# Patient Record
Sex: Female | Born: 1980 | Race: White | Hispanic: No | Marital: Married | Smoking: Never smoker
Health system: Southern US, Community
[De-identification: ages and names within clinical notes are randomized; demographics above are authoritative.]

## PROBLEM LIST (undated history)

## (undated) DIAGNOSIS — T7819XA Other adverse food reactions, not elsewhere classified, initial encounter: Secondary | ICD-10-CM

## (undated) DIAGNOSIS — E079 Disorder of thyroid, unspecified: Secondary | ICD-10-CM

## (undated) DIAGNOSIS — T781XXA Other adverse food reactions, not elsewhere classified, initial encounter: Secondary | ICD-10-CM

## (undated) DIAGNOSIS — J309 Allergic rhinitis, unspecified: Secondary | ICD-10-CM

## (undated) DIAGNOSIS — K589 Irritable bowel syndrome without diarrhea: Secondary | ICD-10-CM

## (undated) HISTORY — DX: Allergic rhinitis, unspecified: J30.9

## (undated) HISTORY — DX: Other adverse food reactions, not elsewhere classified, initial encounter: T78.19XA

## (undated) HISTORY — PX: CHOLECYSTECTOMY: SHX55

## (undated) HISTORY — DX: Other adverse food reactions, not elsewhere classified, initial encounter: T78.1XXA

## (undated) HISTORY — PX: OVARIAN CYST REMOVAL: SHX89

---
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
## 1999-05-16 ENCOUNTER — Encounter: Payer: Self-pay | Admitting: Family Medicine

## 1999-05-16 ENCOUNTER — Ambulatory Visit (HOSPITAL_COMMUNITY): Admission: RE | Admit: 1999-05-16 | Discharge: 1999-05-16 | Payer: Self-pay | Admitting: Family Medicine

## 1999-06-27 ENCOUNTER — Encounter: Payer: Self-pay | Admitting: Family Medicine

## 1999-06-27 ENCOUNTER — Ambulatory Visit (HOSPITAL_COMMUNITY): Admission: RE | Admit: 1999-06-27 | Discharge: 1999-06-27 | Payer: Self-pay | Admitting: Family Medicine

## 2000-05-19 ENCOUNTER — Other Ambulatory Visit: Admission: RE | Admit: 2000-05-19 | Discharge: 2000-05-19 | Payer: Self-pay | Admitting: Obstetrics and Gynecology

## 2001-06-09 ENCOUNTER — Other Ambulatory Visit: Admission: RE | Admit: 2001-06-09 | Discharge: 2001-06-09 | Payer: Self-pay | Admitting: Obstetrics and Gynecology

## 2001-06-27 ENCOUNTER — Encounter: Admission: RE | Admit: 2001-06-27 | Discharge: 2001-06-27 | Payer: Self-pay | Admitting: Obstetrics and Gynecology

## 2001-06-27 ENCOUNTER — Encounter: Payer: Self-pay | Admitting: Obstetrics and Gynecology

## 2002-08-03 ENCOUNTER — Other Ambulatory Visit: Admission: RE | Admit: 2002-08-03 | Discharge: 2002-08-03 | Payer: Self-pay | Admitting: Obstetrics and Gynecology

## 2003-11-28 ENCOUNTER — Other Ambulatory Visit: Admission: RE | Admit: 2003-11-28 | Discharge: 2003-11-28 | Payer: Self-pay | Admitting: Obstetrics and Gynecology

## 2004-04-07 ENCOUNTER — Inpatient Hospital Stay (HOSPITAL_COMMUNITY): Admission: AD | Admit: 2004-04-07 | Discharge: 2004-04-07 | Payer: Self-pay | Admitting: Obstetrics and Gynecology

## 2004-04-10 ENCOUNTER — Ambulatory Visit (HOSPITAL_COMMUNITY): Admission: RE | Admit: 2004-04-10 | Discharge: 2004-04-10 | Payer: Self-pay | Admitting: Obstetrics and Gynecology

## 2004-04-16 ENCOUNTER — Ambulatory Visit (HOSPITAL_COMMUNITY): Admission: RE | Admit: 2004-04-16 | Discharge: 2004-04-16 | Payer: Self-pay | Admitting: Obstetrics and Gynecology

## 2004-06-11 ENCOUNTER — Inpatient Hospital Stay (HOSPITAL_COMMUNITY): Admission: RE | Admit: 2004-06-11 | Discharge: 2004-06-14 | Payer: Self-pay | Admitting: Obstetrics and Gynecology

## 2004-07-23 ENCOUNTER — Other Ambulatory Visit: Admission: RE | Admit: 2004-07-23 | Discharge: 2004-07-23 | Payer: Self-pay | Admitting: Obstetrics and Gynecology

## 2006-04-01 ENCOUNTER — Emergency Department (HOSPITAL_COMMUNITY): Admission: EM | Admit: 2006-04-01 | Discharge: 2006-04-02 | Payer: Self-pay | Admitting: Emergency Medicine

## 2006-07-28 ENCOUNTER — Ambulatory Visit (HOSPITAL_COMMUNITY): Admission: RE | Admit: 2006-07-28 | Discharge: 2006-07-28 | Payer: Self-pay | Admitting: Obstetrics and Gynecology

## 2006-08-04 ENCOUNTER — Ambulatory Visit (HOSPITAL_COMMUNITY): Admission: RE | Admit: 2006-08-04 | Discharge: 2006-08-04 | Payer: Self-pay | Admitting: Obstetrics and Gynecology

## 2006-08-11 ENCOUNTER — Encounter: Payer: Self-pay | Admitting: Obstetrics and Gynecology

## 2006-08-11 ENCOUNTER — Observation Stay (HOSPITAL_COMMUNITY): Admission: AD | Admit: 2006-08-11 | Discharge: 2006-08-12 | Payer: Self-pay | Admitting: Obstetrics and Gynecology

## 2006-08-19 ENCOUNTER — Ambulatory Visit (HOSPITAL_COMMUNITY): Admission: RE | Admit: 2006-08-19 | Discharge: 2006-08-19 | Payer: Self-pay | Admitting: Obstetrics and Gynecology

## 2006-08-27 ENCOUNTER — Ambulatory Visit (HOSPITAL_COMMUNITY): Admission: RE | Admit: 2006-08-27 | Discharge: 2006-08-27 | Payer: Self-pay | Admitting: Obstetrics and Gynecology

## 2006-08-27 ENCOUNTER — Encounter: Payer: Self-pay | Admitting: Obstetrics and Gynecology

## 2006-09-02 ENCOUNTER — Ambulatory Visit (HOSPITAL_COMMUNITY): Admission: RE | Admit: 2006-09-02 | Discharge: 2006-09-02 | Payer: Self-pay | Admitting: Obstetrics and Gynecology

## 2006-09-10 ENCOUNTER — Ambulatory Visit (HOSPITAL_COMMUNITY): Admission: RE | Admit: 2006-09-10 | Discharge: 2006-09-10 | Payer: Self-pay | Admitting: Obstetrics and Gynecology

## 2006-09-12 ENCOUNTER — Inpatient Hospital Stay (HOSPITAL_COMMUNITY): Admission: AD | Admit: 2006-09-12 | Discharge: 2006-09-12 | Payer: Self-pay | Admitting: Obstetrics and Gynecology

## 2006-09-13 ENCOUNTER — Inpatient Hospital Stay (HOSPITAL_COMMUNITY): Admission: AD | Admit: 2006-09-13 | Discharge: 2006-09-16 | Payer: Self-pay | Admitting: Obstetrics and Gynecology

## 2008-01-09 IMAGING — US US OB LIMITED
1 series · 14 of 28 positions shown · non-contrast
Comparison: none

OBSTETRICAL ULTRASOUND:
 This ultrasound was performed in The [HOSPITAL], and the [facility] report will be stored to [REDACTED] PACS.

[Series 1: us ob limited · 14 of 28 slices shown]
[im 2/28]
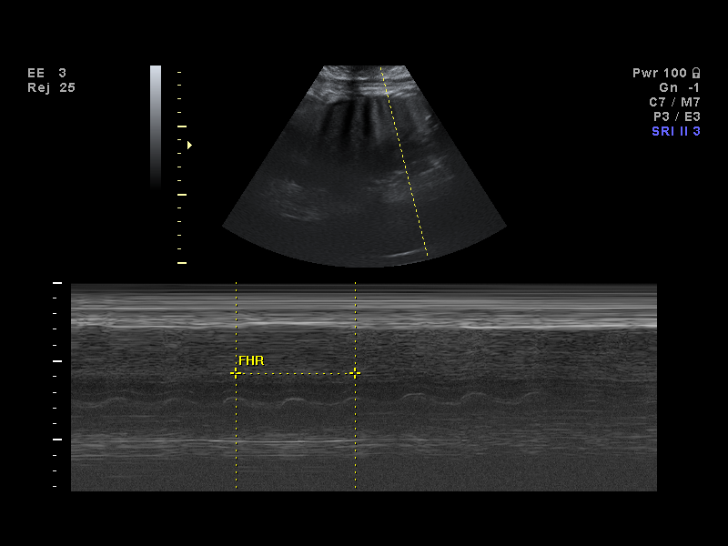
[im 4/28]
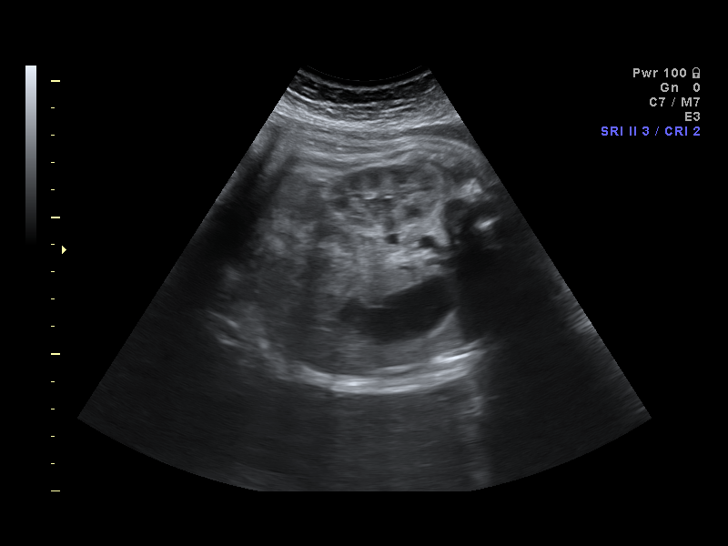
[im 6/28]
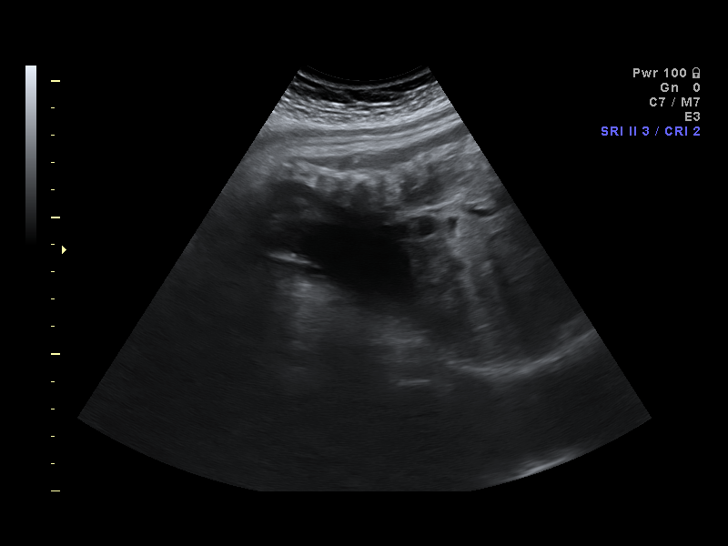
[im 8/28]
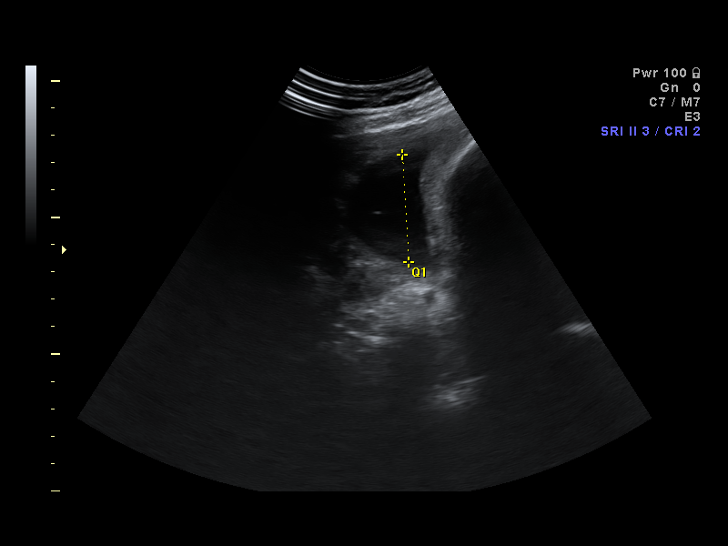
[im 10/28]
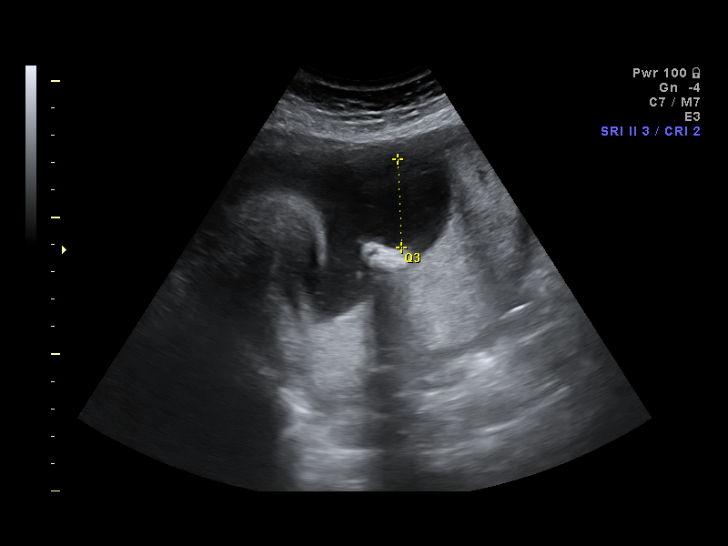
[im 12/28]
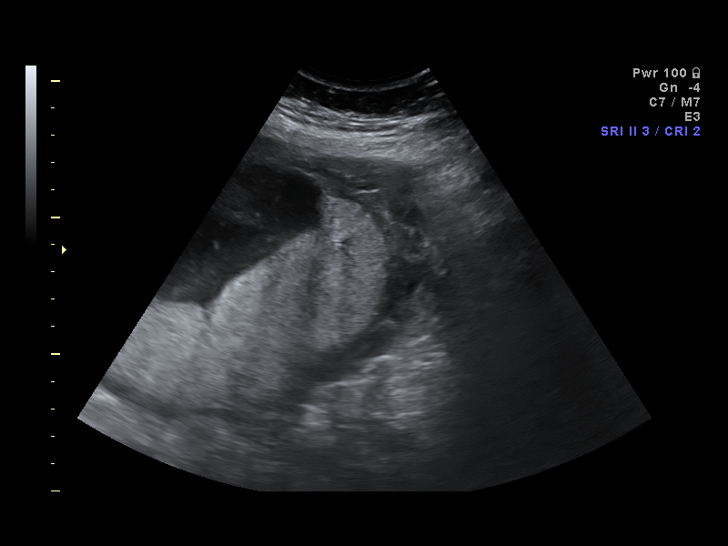
[im 14/28]
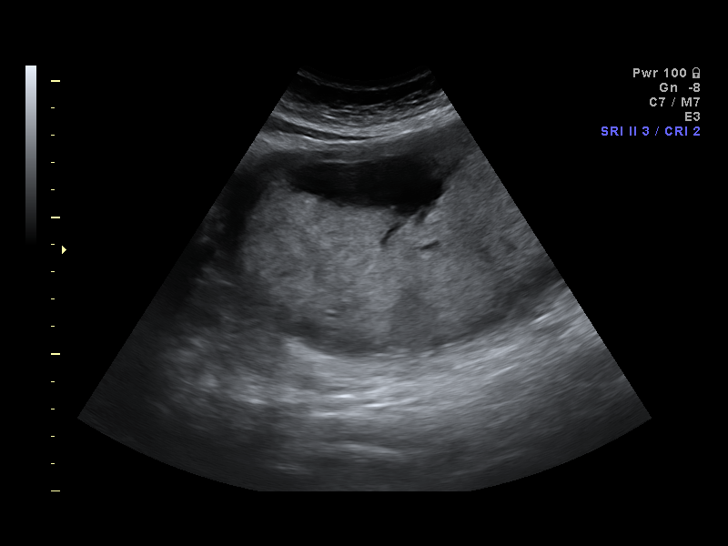
[im 16/28]
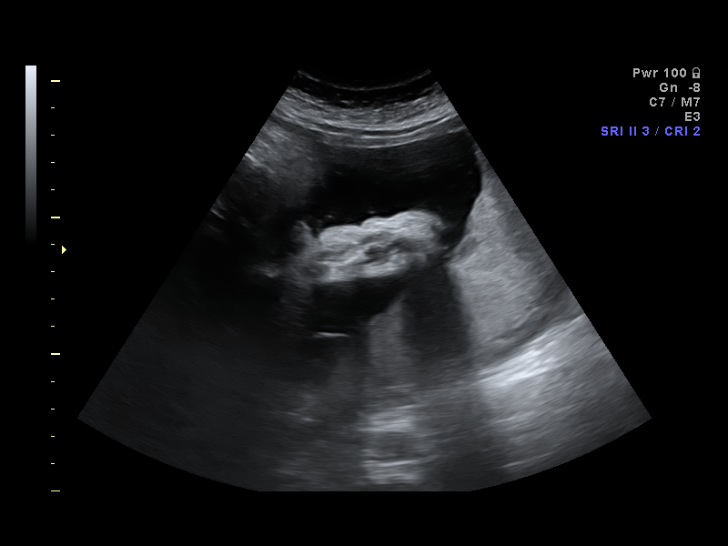
[im 18/28]
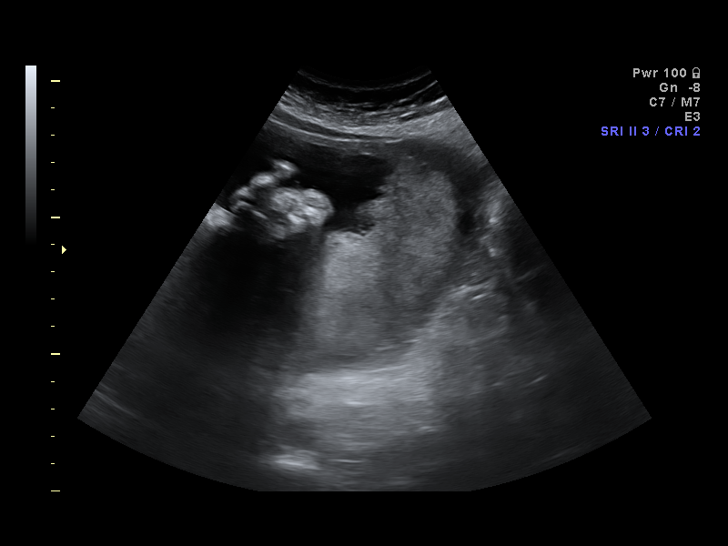
[im 20/28]
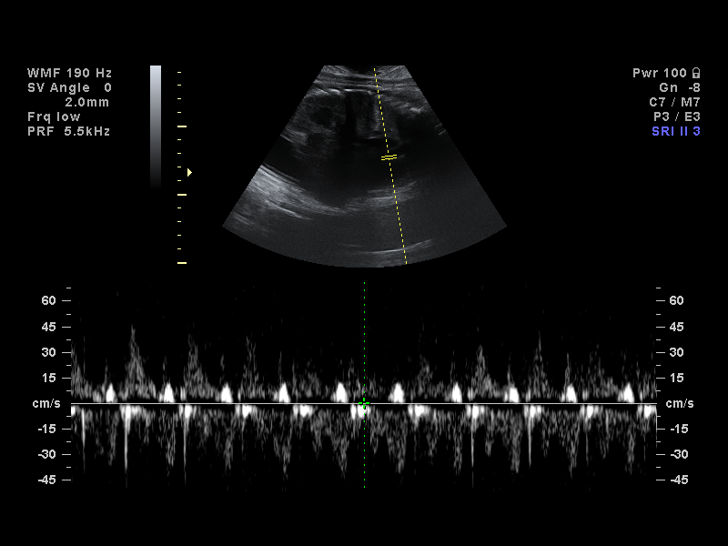
[im 22/28]
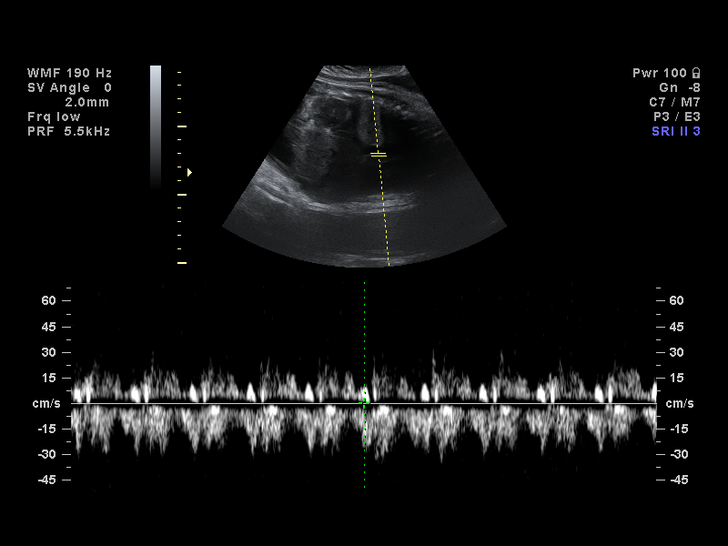
[im 24/28]
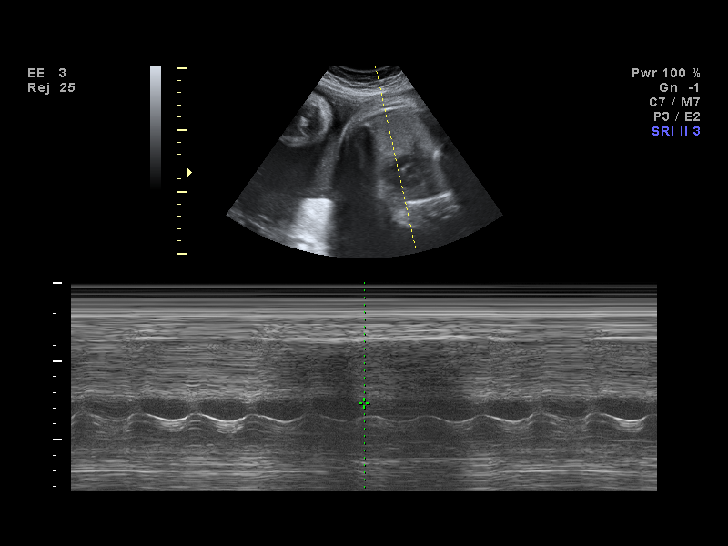
[im 26/28]
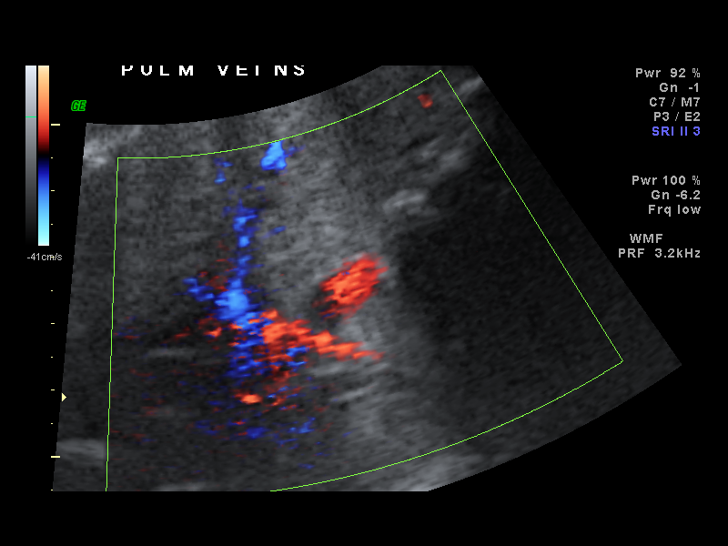
[im 28/28]
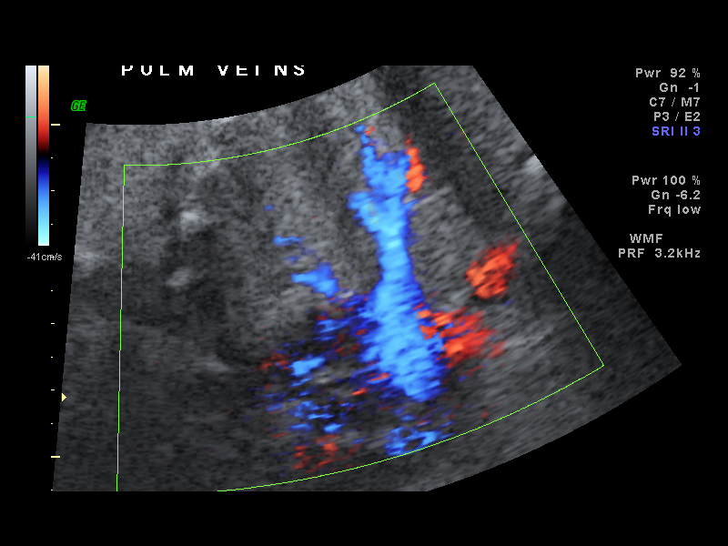

[14 of 28 positions shown; findings below may reference images not displayed]

IMPRESSION: The [facility] report has also been faxed to the ordering physician.

## 2008-01-31 IMAGING — US US OB FOLLOW-UP
1 series · 14 of 22 positions shown · non-contrast
Comparison: none

OBSTETRICAL ULTRASOUND:
 This ultrasound was performed in The [HOSPITAL], and the [facility] report will be stored to [REDACTED] PACS.

[Series 1: us ob follow-up · 14 of 22 slices shown]
[im 1/22]
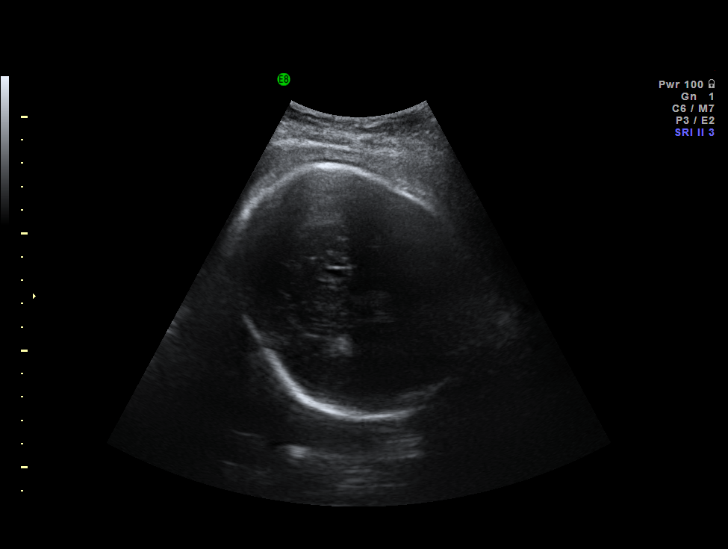
[im 3/22]
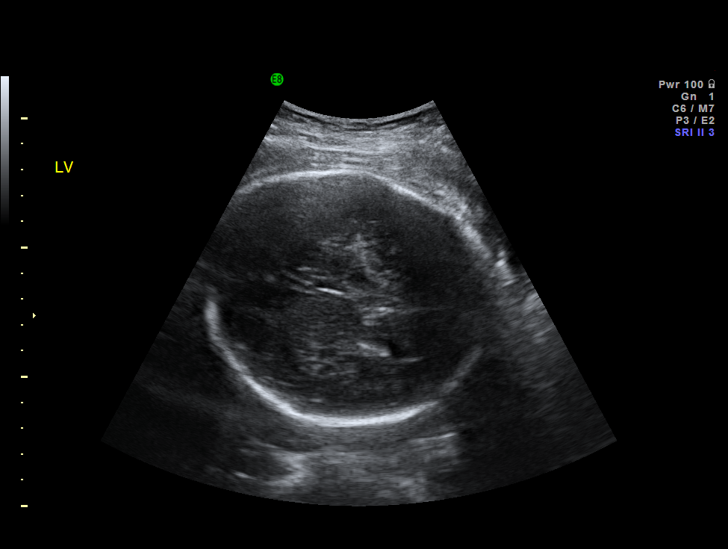
[im 4/22]
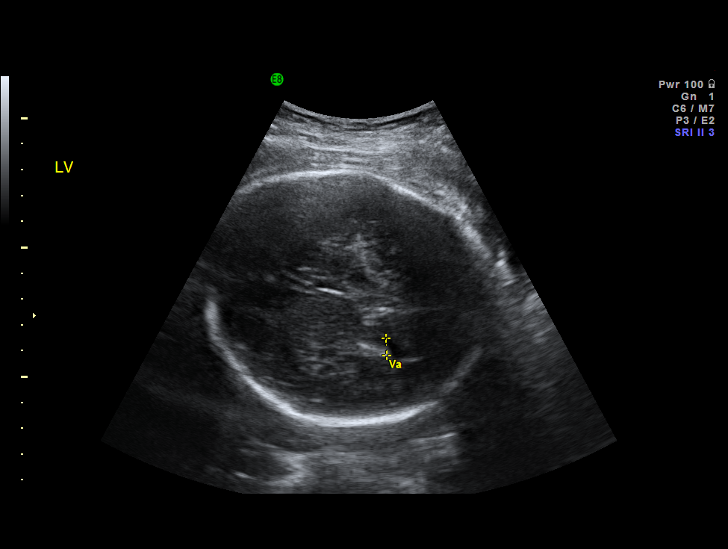
[im 6/22]
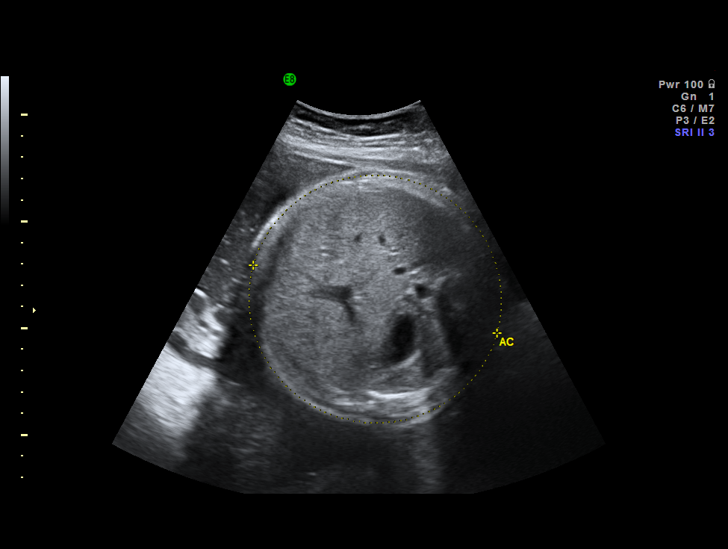
[im 8/22]
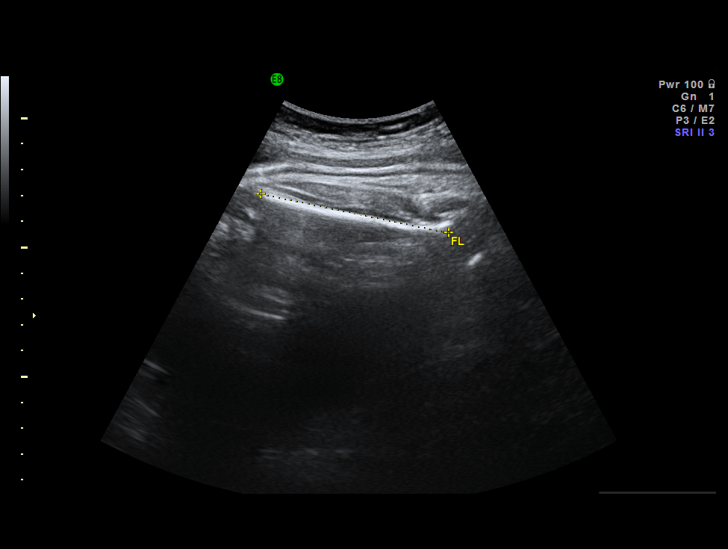
[im 9/22]
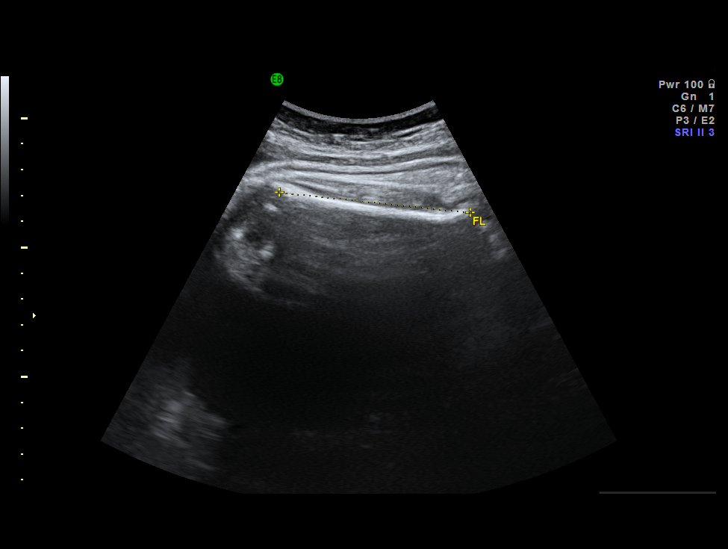
[im 11/22]
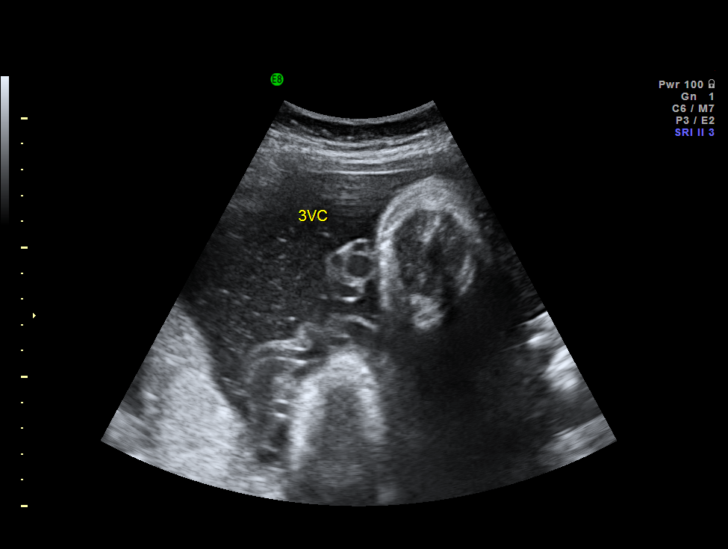
[im 12/22]
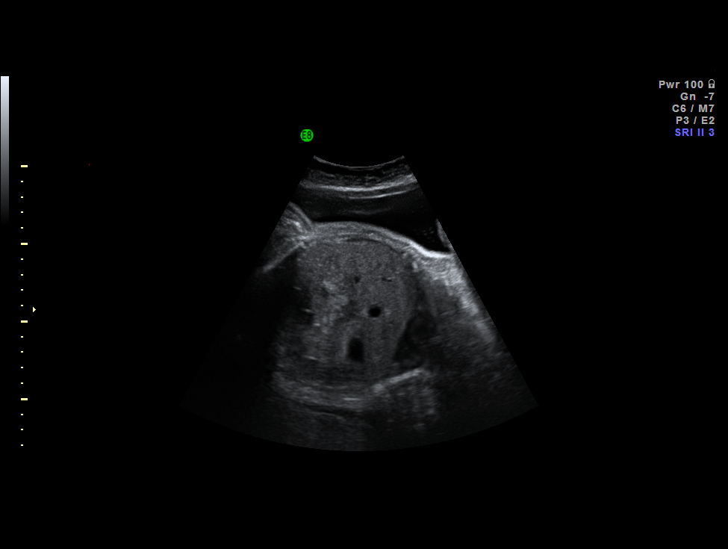
[im 14/22]
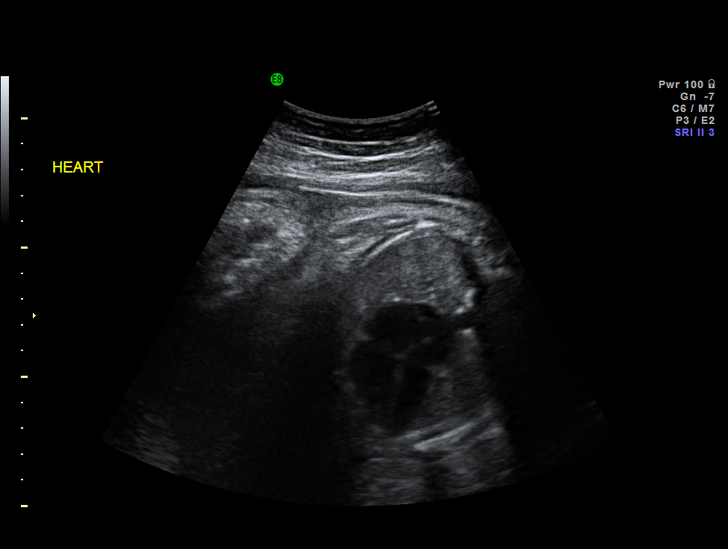
[im 15/22]
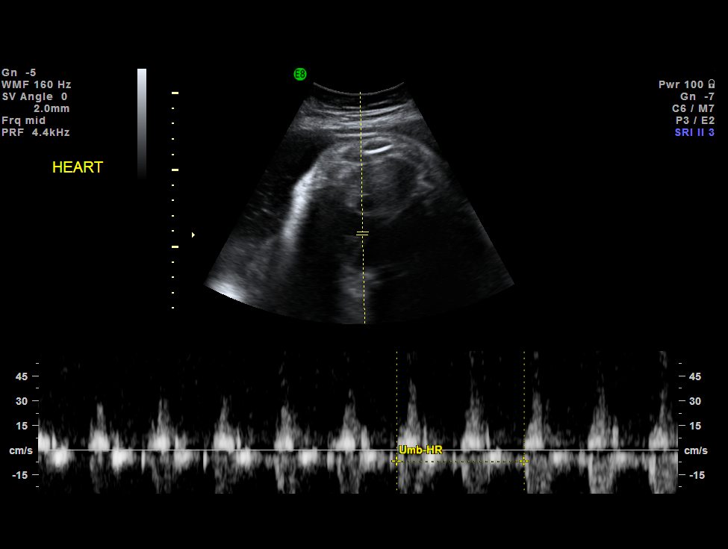
[im 17/22]
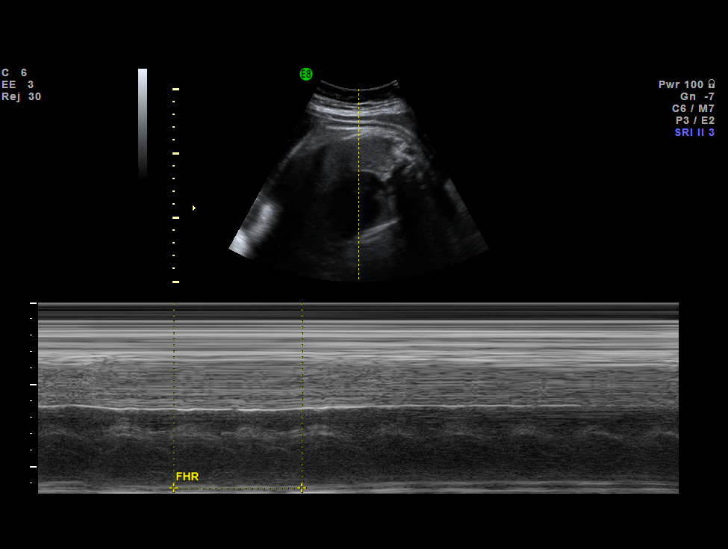
[im 19/22]
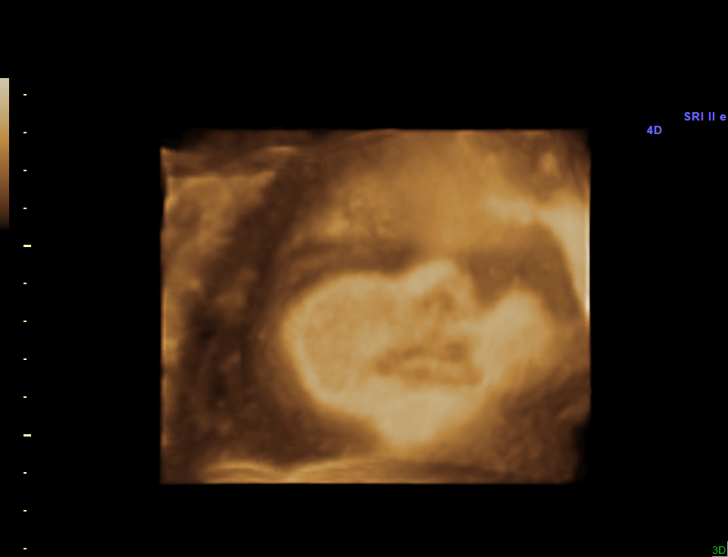
[im 20/22]
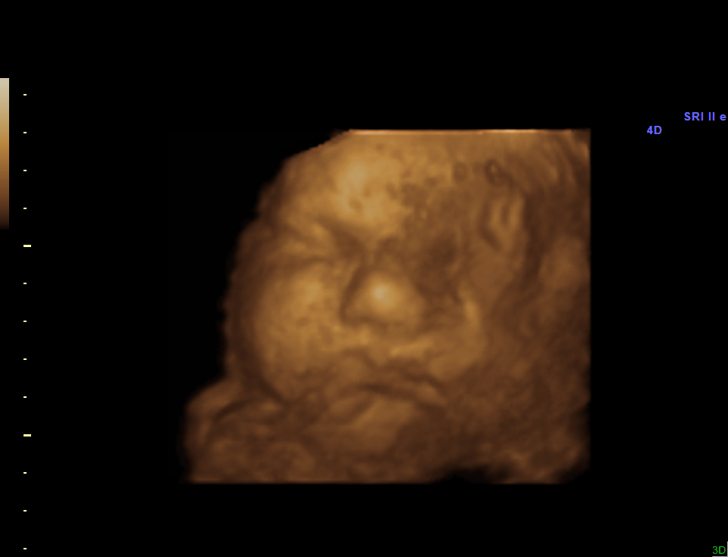
[im 22/22]
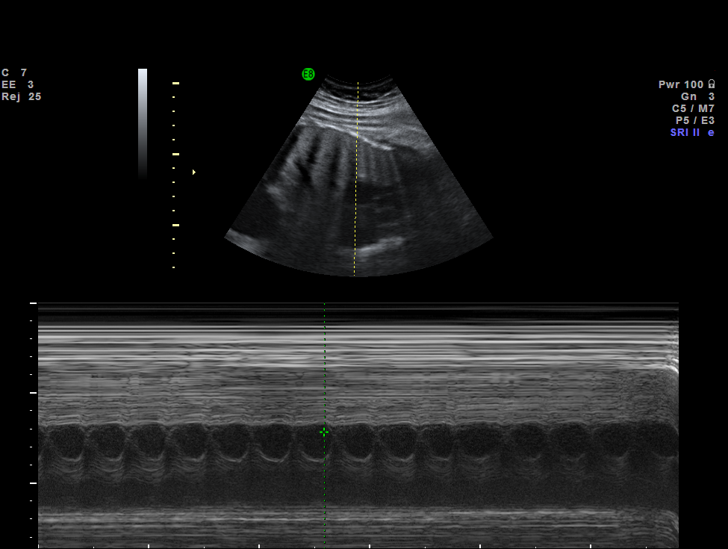

[14 of 22 positions shown; findings below may reference images not displayed]

IMPRESSION: The [facility] report has also been faxed to the ordering physician.

## 2008-12-24 ENCOUNTER — Encounter: Admission: RE | Admit: 2008-12-24 | Discharge: 2008-12-24 | Payer: Self-pay | Admitting: Physician Assistant

## 2009-06-18 ENCOUNTER — Ambulatory Visit (HOSPITAL_COMMUNITY): Admission: RE | Admit: 2009-06-18 | Discharge: 2009-06-18 | Payer: Self-pay | Admitting: Obstetrics and Gynecology

## 2009-06-21 ENCOUNTER — Inpatient Hospital Stay (HOSPITAL_COMMUNITY): Admission: AD | Admit: 2009-06-21 | Discharge: 2009-06-21 | Payer: Self-pay | Admitting: Obstetrics and Gynecology

## 2010-04-11 ENCOUNTER — Encounter
Admission: RE | Admit: 2010-04-11 | Discharge: 2010-04-11 | Payer: Self-pay | Source: Home / Self Care | Attending: Family Medicine | Admitting: Family Medicine

## 2010-04-20 ENCOUNTER — Encounter: Payer: Self-pay | Admitting: Obstetrics and Gynecology

## 2010-06-22 LAB — CBC
HCT: 36 % (ref 36.0–46.0)
Hemoglobin: 12.7 g/dL (ref 12.0–15.0)
MCV: 91.4 fL (ref 78.0–100.0)
RBC: 3.93 MIL/uL (ref 3.87–5.11)
RBC: 4.19 MIL/uL (ref 3.87–5.11)
WBC: 5.6 10*3/uL (ref 4.0–10.5)
WBC: 8.2 10*3/uL (ref 4.0–10.5)

## 2010-08-12 NOTE — Op Note (Signed)
Angel Richmond, Angel Richmond             ACCOUNT NO.:  [id number]   MEDICAL RECORD NO.:  [id number]          PATIENT TYPE:  INP   LOCATION:  9199                          [facility]ACILITY:  [facility]   PHYSICIAN:  Michelle L. Grewal, M.D.DATE OF BIRTH:  1981/02/25   DATE OF PROCEDURE:  09/13/2006  DATE OF DISCHARGE:                               OPERATIVE REPORT   PREOPERATIVE DIAGNOSIS:  Intrauterine pregnancy at term, previous C-  section, SROM and labor.   POSTOPERATIVE DIAGNOSIS:  Intrauterine pregnancy at term, previous C-  section, SROM and labor.   PROCEDURE:  Repeat low transverse cesarean section.   SURGEON:  Dr. Vincente Poli   ANESTHESIA:  Spinal.   SPECIMENS:  Female infant in cephalic presentation, Apgar 9 at 1 minute  and 9 at 5 minutes, weighing 8 pounds 15 ounces.   ESTIMATED BLOOD LOSS:  500 mL.   COMPLICATIONS:  None.   PROCEDURE:  Patient was taken to the operating room after informed  consent was obtained.  She was then prepped and draped in the usual  sterile fashion after spinal was placed. A Foley catheter was inserted  and draining clear urine.  A low transverse incision was made, carried  down to the fascia, fascia scored in the midline and extended laterally.  The rectus muscles were separated in the midline.  The peritoneum was  entered bluntly.  The peritoneal incision was then stretched.  The  bladder blade was inserted in the lower uterine segment and the bladder  flap was created sharply and then digitally.  The bladder blade was  readjusted.  A low transverse incision was made in the uterus. Uterus  was entered using a hemostat and the baby was in cephalic presentation  and was delivered easily with one gentle pull of the vacuum.  The baby  was a female infant weighing 8 pounds 15 ounces with Apgars 9 at one  minute and 9 at 5 minutes.  The cord was clamped and cut, the baby was  handed to the awaiting neonatal team and taken to nursery. The cord  blood was obtained.   The placenta was manually removed and noted to be  normal and intact with three-vessel cord.  The uterus was exteriorized  and cleared of all clots and debris.  Pitocin and antibiotics were  given. The adnexa were normal.  The uterine cavity was cleared of all  clots and debris again and the incision was closed in one layer using 0  chromic in continuous running locked stitch.  Hemostasis was excellent.  The uterus was returned to the abdomen.  Irrigation was performed.  Hemostasis was again noted to be excellent.  The peritoneum was closed  using 0 Vicryl.  The fascia was closed using 0 Vicryl in a running  stitch x2  starting at each corner and meeting in the midline. After irrigation of  subcutaneous layer the skin was closed with a subcuticular using 3-0  Vicryl and skin was closed with Dermabond.  All sponge, lap and  instrument counts were correct x2.  The patient went to recovery room in  stable condition.  Michelle L. Vincente Poli, M.D.  Electronically Signed     MLG/MEDQ  D:  09/13/2006  T:  09/13/2006  Job:  [id number]

## 2010-08-12 NOTE — Discharge Summary (Signed)
NAMEJAMONICA, Angel Richmond NO.:  [id number]   MEDICAL RECORD NO.:  [id number]          PATIENT TYPE:  OUT   LOCATION:  MFM                           [facility]   PHYSICIAN:  Juluis Mire, M.D.   DATE OF BIRTH:  October 22, 1980   DATE OF ADMISSION:  08/11/2006  DATE OF DISCHARGE:                               DISCHARGE SUMMARY   ADMISSION DIAGNOSIS:  Intrauterine pregnancy at 47 and 4/7 weeks with  fetal cardiac arrhythmia.   DISCHARGE DIAGNOSIS:  Intrauterine pregnancy at 27 and 4/7 weeks with  fetal cardiac arrhythmia.   HISTORY AND PHYSICAL:  For a complete history and physical examination,  please see red note.   COURSE IN THE HOSPITAL:  The patient was brought in, underwent  ultrasound at maternal fetal medicine.  Cardiac evaluation appeared to  be normal.  __________  premature atrial contractions.  Subsequently,  was monitored throughout the night.  Fetal heart remained reactive.  No  decelerations.  She did have uterine irritability, although the cervix  remained long and closed with no change.  We contacted maternal fetal  medicine and talked with them.  They were in agreement to let her go  home.  She will see him twice a week in the office for nonstress  testing.  She will have an echocardiogram for the baby and a followup  ultrasound in the maternal fetal medicine.   In terms of complications, none were encountered during stay in the  hospital, the patient was discharged home in stable condition.   DISPOSITION:  Follow up Monday for a repeat NST.  Call with increasing  uterine activity, leaking or bleed or any concerns.      Juluis Mire, M.D.  Electronically Signed     JSM/MEDQ  D:  08/12/2006  T:  08/12/2006  Job:  [id number]

## 2010-08-15 NOTE — Op Note (Signed)
Angel Richmond, Angel Richmond             ACCOUNT NO.:  [id number]   MEDICAL RECORD NO.:  [id number]          PATIENT TYPE:  INP   LOCATION:  9145                          [facility]   PHYSICIAN:  Guy Sandifer. Tomblin II, M.D.DATE OF BIRTH:  03-Jul-1980   DATE OF PROCEDURE:  06/11/2004  DATE OF DISCHARGE:                                 OPERATIVE REPORT   PREOPERATIVE DIAGNOSES:  1.  Intrauterine pregnancy at 85 weeks estimated gestational age.  2.  Back pain.   POSTOPERATIVE DIAGNOSES:  1.  Intrauterine pregnancy at 47 weeks estimated gestational age.  2.  Back pain.   OPERATION PERFORMED:  Low transverse cesarean section.   SURGEON:  Guy Sandifer. Henderson Cloud, M.D.   ANESTHESIA:  Spinal.  Belva Agee, M.D.   ESTIMATED BLOOD LOSS:  800 cc.   FINDINGS:  Viable female infant, Apgars of 9 and 9 at one and five minutes,  respectively.  Birth weight 7 pounds 10 ounces.  Arterial cord pH 7.30.   INDICATIONS FOR PROCEDURE:  The patient is a 30 year old married white  female, G1, P0 at 64 weeks estimated gestational age.  Prenatal care has  been complicated by severe back spasm.  Details are dictated in history and  physical.  Cesarean section has been discussed.  The potential risks and  complications have been reviewed with the patient preoperatively including  but not limited to infection, organ damage, bleeding requiring transfusion  of blood products and possible transfusion reaction, HIV and hepatitis  acquisition, DVT, PE and pneumonia.  All questions have been answered and  consent is signed on the chart.   DESCRIPTION OF PROCEDURE:  The patient was taken to the operating room where  she was identified.  Spinal anesthetic was placed and she was placed in the  dorsal supine position with a 15 degree left lateral wedge.  She was then  prepped.  Foley catheter was placed and the bladder was drained.  She was  draped in a sterile fashion.  After testing for adequate spinal anesthesia,  the skin was entered through a Pfannenstiel incision, dissection was carried  out in layers to the peritoneum.  The peritoneum was incised and extended  superiorly and inferiorly.  Vesicouterine peritoneum was taken down  cephalolaterally.  The bladder flap was developed.  The bladder blade was  placed.  The uterus was incised in a low transverse manner and the uterine  cavity was entered bluntly with a hemostat.  The uterine incision was  extended cephalolaterally with the fingers.  Artificial rupture of membranes  with clear fluid was carried out.  Vertex delivered and the oropharynx and  nasopharynx were suctioned.  The remainder of the baby was delivered.  Good  cry and tone was noted.  The cord was clamped and cut and the baby was  handed to the waiting pediatrics team.  The placenta was manually delivered.  The uterine cavity was cleaned.  The uterus was closed in two running,  locking imbricating layers with 0 Monocryl suture which achieved good  hemostasis.  Tubes and ovaries were normal bilaterally.  Anterior peritoneum  was closed with running fashion with 0 Monocryl suture  which was also used to reapproximate the pyramidalis muscle in the midline.  Anterior rectus fascia was closed in a running fashion with 0 PDS suture and  the skin was closed with clips.  All sponge, needle and instrument counts  were correct and the patient was transferred to recovery room in stable  condition.      JET/MEDQ  D:  06/11/2004  T:  06/11/2004  Job:  [id number]

## 2010-08-15 NOTE — Discharge Summary (Signed)
Angel Richmond, Angel Richmond             ACCOUNT NO.:  [id number]   MEDICAL RECORD NO.:  [id number]          PATIENT TYPE:  INP   LOCATION:  9102                          [facility]   PHYSICIAN:  Dineen Kid. Rana Snare, M.D.    DATE OF BIRTH:  [birth year]   DATE OF ADMISSION:  09/13/2006  DATE OF DISCHARGE:  09/16/2006                               DISCHARGE SUMMARY   ADMISSION DIAGNOSES:  1. Intrauterine pregnancy at 34 weeks estimated gestational age.  2. Spontaneous rupture of membranes.  3. Previous cesarean section desires repeat.   DISCHARGE DIAGNOSIS:  1. Status post low transverse cesarean section.  2. A viable female infant.   PROCEDURE:  Repeat low transverse cesarean section.   REASON FOR ADMISSION:  Please see written H&P.   HOSPITAL COURSE:  The patient is 30 year old gravida 2, para 1 that  presented to [facility] at 38 weeks estimated  gestational age with spontaneous rupture of membranes.  The patient had  had a previous cesarean section, desired repeat.  The patient was then  transferred to the operating room where spinal anesthesia was  administered without difficulty.  Low transverse incision was made with  delivery of a viable female infant weighing 8 pounds 15 ounces Apgars of  9 at 1 and 9 at 5 minutes.  The patient tolerated procedure well and  taken to the recovery room in stable condition.  On postoperative day #1  the patient was without complaint.  Vital signs stable.  She was  afebrile.  Abdomen soft.  Fundus firm and nontender.  Incision was  clean, dry and intact. Subcuticular closure was noted.  Laboratory  findings revealed hemoglobin of 12.0, platelet count 212,000, WBC count  of 8.8.  Postoperative day #2 the patient was without complaint.  Vital  signs were stable.  She is afebrile.  Abdomen soft.  Fundus firm and  nontender.  Incision was clean, dry and intact.  The patient is  ambulating well and tolerating a regular diet without  complaints of  nausea.  Postoperative day #3 the patient is doing well.  Vital signs  were stable.  She was afebrile.  Abdomen soft.  Fundus firm and  nontender.  Incision was clean, dry and intact.  Discharge instructions  were reviewed and the patient was later discharged home.   CONDITION ON DISCHARGE:  Good, diet regular as tolerated.   ACTIVITY:  No heavy lifting, no driving x2 weeks, no vaginal entry.   FOLLOW UP:  Patient to follow up in office 1-2 weeks for incision check.  She is to call for temperature greater than 100 degrees, persistent  nausea or vomiting, heavy vaginal bleeding and/or redness or drainage  from incisional site.   DISCHARGE MEDICATIONS:  1. Tylox #30 one p.o. every 4-6 hours.  2. Motrin 600 mg every 6 hours.  3. Prenatal vitamins one p.o. daily.  4. Colace one p.o. daily p.r.n.      Julio Sicks, N.P.      Dineen Kid Rana Snare, M.D.  Electronically Signed    CC/MEDQ  D:  10/13/2006  T:  10/13/2006  Job:  [id number]

## 2010-08-15 NOTE — H&P (Signed)
Angel Richmond, Angel Richmond             ACCOUNT NO.:  [id number]   MEDICAL RECORD NO.:  [id number]          PATIENT TYPE:  INP   LOCATION:  NA                            [facility]   PHYSICIAN:  Guy Sandifer. Tomblin II, M.D.DATE OF BIRTH:  March 25, 1981   DATE OF ADMISSION:  06/11/2004  DATE OF DISCHARGE:                                HISTORY & PHYSICAL   CHIEF COMPLAINT:  Desires cesarean section.   HISTORY OF PRESENT ILLNESS:  This patient is a 30 year old married white  female G1, P0 with an EDC of June 18, 2004 consistent with last menstrual  period, early exam and an 18-week ultrasound placing her at 60 weeks  estimated gestational age.  Prenatal care has been complicated by severe hip  and low back muscle strain.  After discussion of the options, she is being  admitted for a primary cesarean section.   PAST MEDICAL HISTORY, PAST SURGICAL HISTORY, FAMILY HISTORY, OBSTETRIC  HISTORY:  See prenatal history and physical.   MEDICATIONS:  1.  Prenatal vitamins.  2.  Vicodin p.r.n.  3.  Flexeril 10 mg b.i.d. p.r.n.   ALLERGIES:  No known drug allergies.   SOCIAL HISTORY:  Patient denies tobacco, alcohol or drug abuse.   PHYSICAL EXAMINATION:  Height 5 feet 3 inches, weight 183 pounds, blood  pressure 128/84.  Lungs clear to auscultation.  Heart regular rate and  rhythm without rubs.  Breasts not examined.  Abdomen gravid with no  epigastric tenderness.  Cervix closed, thick and high.  Extremities/neurological exam grossly within normal limits.   ASSESSMENT:  1.  Intrauterine pregnancy at 57 weeks estimated gestational age.  2.  Back spasm.   PLAN:  Cesarean section.      JET/MEDQ  D:  06/10/2004  T:  06/10/2004  Job:  [id number]

## 2011-01-14 LAB — CBC
HCT: 36
HCT: 39.1
Hemoglobin: 13.2
MCHC: 33.3
MCV: 90.6
MCV: 92.3
Platelets: 237
RBC: 3.9
RDW: 13

## 2011-12-16 ENCOUNTER — Other Ambulatory Visit: Payer: Self-pay | Admitting: Gastroenterology

## 2014-05-15 ENCOUNTER — Emergency Department (HOSPITAL_COMMUNITY)
Admission: EM | Admit: 2014-05-15 | Discharge: 2014-05-16 | Disposition: A | Discharge status: Home / Self Care | Payer: BC Managed Care – PPO | Attending: Emergency Medicine | Admitting: Emergency Medicine

## 2014-05-15 ENCOUNTER — Emergency Department (HOSPITAL_COMMUNITY): Payer: BC Managed Care – PPO

## 2014-05-15 ENCOUNTER — Encounter (HOSPITAL_COMMUNITY): Payer: Self-pay

## 2014-05-15 DIAGNOSIS — Z8719 Personal history of other diseases of the digestive system: Secondary | ICD-10-CM | POA: Diagnosis not present

## 2014-05-15 DIAGNOSIS — R109 Unspecified abdominal pain: Secondary | ICD-10-CM

## 2014-05-15 DIAGNOSIS — R1013 Epigastric pain: Secondary | ICD-10-CM | POA: Insufficient documentation

## 2014-05-15 DIAGNOSIS — Z9049 Acquired absence of other specified parts of digestive tract: Secondary | ICD-10-CM | POA: Diagnosis not present

## 2014-05-15 DIAGNOSIS — Z79899 Other long term (current) drug therapy: Secondary | ICD-10-CM | POA: Diagnosis not present

## 2014-05-15 DIAGNOSIS — R1031 Right lower quadrant pain: Secondary | ICD-10-CM | POA: Diagnosis not present

## 2014-05-15 DIAGNOSIS — Z3202 Encounter for pregnancy test, result negative: Secondary | ICD-10-CM | POA: Diagnosis not present

## 2014-05-15 DIAGNOSIS — R000 Tachycardia, unspecified: Secondary | ICD-10-CM | POA: Insufficient documentation

## 2014-05-15 DIAGNOSIS — E079 Disorder of thyroid, unspecified: Secondary | ICD-10-CM | POA: Diagnosis not present

## 2014-05-15 HISTORY — DX: Irritable bowel syndrome, unspecified: K58.9

## 2014-05-15 HISTORY — DX: Disorder of thyroid, unspecified: E07.9

## 2014-05-15 LAB — CBC WITH DIFFERENTIAL/PLATELET
BASOS PCT: 0 % (ref 0–1)
Basophils Absolute: 0 10*3/uL (ref 0.0–0.1)
EOS ABS: 0 10*3/uL (ref 0.0–0.7)
Eosinophils Relative: 0 % (ref 0–5)
HCT: 44.2 % (ref 36.0–46.0)
Hemoglobin: 14.3 g/dL (ref 12.0–15.0)
LYMPHS ABS: 0.6 10*3/uL — AB (ref 0.7–4.0)
Lymphocytes Relative: 5 % — ABNORMAL LOW (ref 12–46)
MCH: 29.2 pg (ref 26.0–34.0)
MCHC: 32.4 g/dL (ref 30.0–36.0)
MCV: 90.4 fL (ref 78.0–100.0)
Monocytes Absolute: 0.4 10*3/uL (ref 0.1–1.0)
Monocytes Relative: 3 % (ref 3–12)
NEUTROS ABS: 12.3 10*3/uL — AB (ref 1.7–7.7)
NEUTROS PCT: 92 % — AB (ref 43–77)
PLATELETS: 371 10*3/uL (ref 150–400)
RBC: 4.89 MIL/uL (ref 3.87–5.11)
RDW: 12.5 % (ref 11.5–15.5)
WBC: 13.4 10*3/uL — ABNORMAL HIGH (ref 4.0–10.5)

## 2014-05-15 LAB — COMPREHENSIVE METABOLIC PANEL
ALT: 15 U/L (ref 0–35)
ANION GAP: 11 (ref 5–15)
AST: 32 U/L (ref 0–37)
Albumin: 4.2 g/dL (ref 3.5–5.2)
Alkaline Phosphatase: 83 U/L (ref 39–117)
BUN: 12 mg/dL (ref 6–23)
CALCIUM: 8.8 mg/dL (ref 8.4–10.5)
CO2: 23 mmol/L (ref 19–32)
CREATININE: 0.61 mg/dL (ref 0.50–1.10)
Chloride: 103 mmol/L (ref 96–112)
GFR calc non Af Amer: >90 mL/min (ref >90)
GLUCOSE: 107 mg/dL — AB (ref 70–99)
Potassium: 4 mmol/L (ref 3.5–5.1)
SODIUM: 137 mmol/L (ref 135–145)
TOTAL PROTEIN: 7.9 g/dL (ref 6.0–8.3)
Total Bilirubin: 0.8 mg/dL (ref 0.3–1.2)

## 2014-05-15 LAB — URINALYSIS, ROUTINE W REFLEX MICROSCOPIC
BILIRUBIN URINE: NEGATIVE
GLUCOSE, UA: NEGATIVE mg/dL
HGB URINE DIPSTICK: NEGATIVE
Ketones, ur: 40 mg/dL — AB
Leukocytes, UA: NEGATIVE
NITRITE: NEGATIVE
PH: 7.5 (ref 5.0–8.0)
Protein, ur: NEGATIVE mg/dL
SPECIFIC GRAVITY, URINE: 1.022 (ref 1.005–1.030)
Urobilinogen, UA: 1 mg/dL (ref 0.0–1.0)

## 2014-05-15 LAB — POC URINE PREG, ED: Preg Test, Ur: NEGATIVE

## 2014-05-15 MED ORDER — OXYCODONE-ACETAMINOPHEN 5-325 MG PO TABS: 2.0000 | ORAL_TABLET | ORAL | Status: DC | PRN

## 2014-05-15 MED ORDER — SUCRALFATE 1 G PO TABS
1.0000 g | ORAL_TABLET | Freq: Four times a day (QID) | ORAL | Status: DC
Start: 2014-05-15 — End: 2022-08-07

## 2014-05-15 MED ORDER — DICYCLOMINE HCL 20 MG PO TABS: 20.0000 mg | ORAL_TABLET | Freq: Two times a day (BID) | ORAL | Status: DC

## 2014-05-15 MED ORDER — SUCRALFATE 1 G PO TABS
1.0000 g | ORAL_TABLET | Freq: Once | ORAL | Status: AC
  Administered 2014-05-16: 1 g via ORAL
  Filled 2014-05-15: qty 1

## 2014-05-15 MED ORDER — PANTOPRAZOLE SODIUM 40 MG PO TBEC
40.0000 mg | DELAYED_RELEASE_TABLET | Freq: Every day | ORAL | Status: DC
  Administered 2014-05-16: 40 mg via ORAL
  Filled 2014-05-15: qty 1

## 2014-05-15 MED ORDER — ONDANSETRON HCL 4 MG/2ML IJ SOLN
4.0000 mg | Freq: Once | INTRAMUSCULAR | Status: AC
  Administered 2014-05-15: 4 mg via INTRAVENOUS
  Filled 2014-05-15: qty 2

## 2014-05-15 MED ORDER — MORPHINE SULFATE 4 MG/ML IJ SOLN
4.0000 mg | Freq: Once | INTRAMUSCULAR | Status: AC
  Administered 2014-05-15: 4 mg via INTRAVENOUS
  Filled 2014-05-15: qty 1

## 2014-05-15 MED ORDER — SODIUM CHLORIDE 0.9 % IV SOLN
INTRAVENOUS | Status: DC
  Administered 2014-05-15: 125 mL/h via INTRAVENOUS

## 2014-05-15 MED ORDER — IOHEXOL 300 MG/ML  SOLN
100.0000 mL | Freq: Once | INTRAMUSCULAR | Status: AC | PRN
  Administered 2014-05-15: 100 mL via INTRAVENOUS

## 2014-05-15 MED ORDER — DICYCLOMINE HCL 10 MG PO CAPS
10.0000 mg | ORAL_CAPSULE | Freq: Once | ORAL | Status: AC
  Administered 2014-05-16: 10 mg via ORAL
  Filled 2014-05-15: qty 1

## 2014-05-15 MED ORDER — SODIUM CHLORIDE 0.9 % IV BOLUS (SEPSIS)
1000.0000 mL | Freq: Once | INTRAVENOUS | Status: AC
  Administered 2014-05-15: 1000 mL via INTRAVENOUS

## 2014-05-15 NOTE — ED Provider Notes (Signed)
CSN: [id number]     Arrival date & time 05/15/14  1734 History   First MD Initiated Contact with Patient 05/15/14 2031     Chief Complaint  Patient presents with  . Abdominal Pain     (Consider location/radiation/quality/duration/timing/severity/associated sxs/prior Treatment) HPI Comments: Patient here complaining of sudden onset of mid epigastric abdominal pain which began last night characterized as sharp and stabbing and not radiating to her right lower quadrant. She has had emesis 2 as well as watery diarrhea. Does have a history of IBS but states that this is different. Denies any urinary symptoms. No vaginal bleeding or discharge. Pain is been coming and going and no recent dysuria or hematuria. No treatment use prior to arrival and no history of same.  Patient is a 34 y.o. female presenting with abdominal pain. The history is provided by the patient and the spouse.  Abdominal Pain   Past Medical History  Diagnosis Date  . IBS (irritable bowel syndrome)   . Thyroid disease    Past Surgical History  Procedure Laterality Date  . Cesarean section    . Cholecystectomy    . Ovarian cyst removal     No family history on file. History  Substance Use Topics  . Smoking status: Never Smoker   . Smokeless tobacco: Not on file  . Alcohol Use: No   OB History    No data available     Review of Systems  Gastrointestinal: Positive for abdominal pain.  All other systems reviewed and are negative.     Allergies  Review of patient's allergies indicates no known allergies.  Home Medications   Prior to Admission medications   Medication Sig Start Date End Date Taking? Authorizing Provider  FLUoxetine (PROZAC) 20 MG tablet Take 30 mg by mouth daily.   Yes Historical Provider, MD  ibuprofen (ADVIL,MOTRIN) 200 MG tablet Take 400 mg by mouth every 6 (six) hours as needed for moderate pain.   Yes Historical Provider, MD  levothyroxine (SYNTHROID, LEVOTHROID) 125 MCG tablet Take  125 mcg by mouth daily before breakfast.   Yes Historical Provider, MD  MAGNESIUM CHLORIDE ER PO Take 1 tablet by mouth daily.   Yes Historical Provider, MD  Melatonin 10 MG TABS Take 10 mg by mouth at bedtime.   Yes Historical Provider, MD   BP 127/82 mmHg  Pulse 115  Temp(Src) 97.8 F (36.6 C) (Oral)  Resp 18  SpO2 100%  LMP 05/08/2014 (Approximate) Physical Exam  Constitutional: She is oriented to person, place, and time. She appears well-developed and well-nourished.  Non-toxic appearance. No distress.  HENT:  Head: Normocephalic and atraumatic.  Eyes: Conjunctivae, EOM and lids are normal. Pupils are equal, round, and reactive to light.  Neck: Normal range of motion. Neck supple. No tracheal deviation present. No thyroid mass present.  Cardiovascular: Regular rhythm and normal heart sounds.  Tachycardia present.  Exam reveals no gallop.   No murmur heard. Pulmonary/Chest: Effort normal and breath sounds normal. No stridor. No respiratory distress. She has no decreased breath sounds. She has no wheezes. She has no rhonchi. She has no rales.  Abdominal: Soft. Normal appearance and bowel sounds are normal. She exhibits no distension. There is tenderness in the right lower quadrant and epigastric area. There is no rigidity, no rebound, no guarding and no CVA tenderness.    Musculoskeletal: Normal range of motion. She exhibits no edema or tenderness.  Neurological: She is alert and oriented to person, place, and time. She has  normal strength. No cranial nerve deficit or sensory deficit. GCS eye subscore is 4. GCS verbal subscore is 5. GCS motor subscore is 6.  Skin: Skin is warm and dry. No abrasion and no rash noted.  Psychiatric: She has a normal mood and affect. Her speech is normal and behavior is normal.  Nursing note and vitals reviewed.   ED Course  Procedures (including critical care time) Labs Review Labs Reviewed  CBC WITH DIFFERENTIAL/PLATELET - Abnormal; Notable for the  following:    WBC 13.4 (*)    Neutrophils Relative % 92 (*)    Neutro Abs 12.3 (*)    Lymphocytes Relative 5 (*)    Lymphs Abs 0.6 (*)    All other components within normal limits  COMPREHENSIVE METABOLIC PANEL - Abnormal; Notable for the following:    Glucose, Bld 107 (*)    All other components within normal limits  URINALYSIS, ROUTINE W REFLEX MICROSCOPIC - Abnormal; Notable for the following:    Ketones, ur 40 (*)    All other components within normal limits  POC URINE PREG, ED    Imaging Review No results found.   EKG Interpretation None      MDM   Final diagnoses:  Abdominal pain    Patient given IV fluids and pain meds and feels better. Patient does have a history of IBS and I believe this was causing her current symptoms. Repeat abdominal exam at time of discharge remains nonsurgical. Patient follow-up with her gastroenterologist and we placed on Bentyl and pain meds. She denies any vaginal bleeding or discharge.    Toy Baker, MD 05/15/14 [phone]

## 2014-05-15 NOTE — ED Notes (Signed)
Pt presents with c/o abdominal pain that started in the middle of the night. Pt reports vomiting and diarrhea. Pt reports one episode of vomiting and 2 episodes of diarrhea.

## 2014-05-15 NOTE — Discharge Instructions (Signed)
Please return here if you have vomiting, severe abdominal pain, blood in your stool, or any other problems. Follow-up with your gastric Abdominal Pain Many things can cause abdominal pain. Usually, abdominal pain is not caused by a disease and will improve without treatment. It can often be observed and treated at home. Your health care provider will do a physical exam and possibly order blood tests and X-rays to help determine the seriousness of your pain. However, in many cases, more time must pass before a clear cause of the pain can be found. Before that point, your health care provider may not know if you need more testing or further treatment. HOME CARE INSTRUCTIONS  Monitor your abdominal pain for any changes. The following actions may help to alleviate any discomfort you are experiencing:  Only take over-the-counter or prescription medicines as directed by your health care provider.  Do not take laxatives unless directed to do so by your health care provider.  Try a clear liquid diet (broth, tea, or water) as directed by your health care provider. Slowly move to a bland diet as tolerated. SEEK MEDICAL CARE IF:  You have unexplained abdominal pain.  You have abdominal pain associated with nausea or diarrhea.  You have pain when you urinate or have a bowel movement.  You experience abdominal pain that wakes you in the night.  You have abdominal pain that is worsened or improved by eating food.  You have abdominal pain that is worsened with eating fatty foods.  You have a fever. SEEK IMMEDIATE MEDICAL CARE IF:   Your pain does not go away within 2 hours.  You keep throwing up (vomiting).  Your pain is felt only in portions of the abdomen, such as the right side or the left lower portion of the abdomen.  You pass bloody or black tarry stools. MAKE SURE YOU:  Understand these instructions.   Will watch your condition.   Will get help right away if you are not doing well  or get worse.  Document Released: 12/24/2004 Document Revised: 03/21/2013 Document Reviewed: 11/23/2012 [facility] Patient Information 2015 [place], [place]. This information is not intended to replace advice given to you by your health care provider. Make sure you discuss any questions you have with your health care provider.

## 2014-05-16 MED ORDER — ONDANSETRON 8 MG PO TBDP
8.0000 mg | ORAL_TABLET | Freq: Once | ORAL | Status: AC
  Administered 2014-05-16: 8 mg via ORAL
  Filled 2014-05-16: qty 1

## 2014-05-16 MED ORDER — OXYCODONE-ACETAMINOPHEN 5-325 MG PO TABS
2.0000 | ORAL_TABLET | Freq: Once | ORAL | Status: AC
  Administered 2014-05-16: 2 via ORAL
  Filled 2014-05-16: qty 2

## 2014-05-16 NOTE — ED Notes (Signed)
Patient having abdominal pain and is requesting to see the doctor.

## 2014-05-16 NOTE — ED Notes (Signed)
Patient feeling nauseated

## 2014-05-25 ENCOUNTER — Telehealth: Payer: Self-pay | Admitting: Internal Medicine

## 2014-05-25 NOTE — Telephone Encounter (Signed)
Per pt she would like to transfer care from [facility], because she states he didn't really seem to care if she was having a problem. She states he didn't even examen her. [facility] Rhea BeltonPyrtle to review as DOD.

## 2014-05-31 NOTE — Telephone Encounter (Signed)
Per Dr Rhea BeltonPyrtle, he has reviewed Dr Daphine DeutscherMartin Johnson's records and states "patient seen within the past week by local GI MD. Would recommend she continue with care where she is established.

## 2014-06-04 ENCOUNTER — Telehealth: Payer: Self-pay | Admitting: Gastroenterology

## 2014-06-04 NOTE — Telephone Encounter (Signed)
3/7 patient informed of Dr. Larae GroomsJacob's and Dr. Lauro FranklinPyrtle's denial and recommendations.  Pt understood.  Records have been shredded.

## 2014-08-22 ENCOUNTER — Other Ambulatory Visit: Payer: Self-pay | Admitting: Obstetrics and Gynecology

## 2014-08-24 LAB — CYTOLOGY - PAP

## 2014-12-20 DIAGNOSIS — H109 Unspecified conjunctivitis: Secondary | ICD-10-CM | POA: Insufficient documentation

## 2014-12-20 DIAGNOSIS — H1010 Acute atopic conjunctivitis, unspecified eye: Secondary | ICD-10-CM | POA: Insufficient documentation

## 2014-12-20 DIAGNOSIS — J309 Allergic rhinitis, unspecified: Principal | ICD-10-CM

## 2018-04-04 ENCOUNTER — Other Ambulatory Visit: Payer: Self-pay | Admitting: Radiology

## 2018-04-04 DIAGNOSIS — N644 Mastodynia: Secondary | ICD-10-CM

## 2018-04-08 ENCOUNTER — Ambulatory Visit
Admission: RE | Admit: 2018-04-08 | Discharge: 2018-04-08 | Disposition: A | Discharge status: Home / Self Care | Payer: BC Managed Care – PPO | Source: Ambulatory Visit | Attending: Radiology | Admitting: Radiology

## 2018-04-08 ENCOUNTER — Other Ambulatory Visit: Payer: Self-pay | Admitting: Radiology

## 2018-04-08 DIAGNOSIS — N644 Mastodynia: Secondary | ICD-10-CM

## 2018-04-08 DIAGNOSIS — N6310 Unspecified lump in the right breast, unspecified quadrant: Secondary | ICD-10-CM

## 2018-10-10 ENCOUNTER — Other Ambulatory Visit: Payer: BC Managed Care – PPO

## 2018-10-24 ENCOUNTER — Other Ambulatory Visit: Payer: Self-pay | Admitting: Radiology

## 2018-10-24 ENCOUNTER — Ambulatory Visit
Admission: RE | Admit: 2018-10-24 | Discharge: 2018-10-24 | Disposition: A | Discharge status: Home / Self Care | Payer: BC Managed Care – PPO | Source: Ambulatory Visit | Attending: Radiology | Admitting: Radiology

## 2018-10-24 ENCOUNTER — Other Ambulatory Visit: Payer: Self-pay

## 2018-10-24 DIAGNOSIS — N6310 Unspecified lump in the right breast, unspecified quadrant: Secondary | ICD-10-CM

## 2019-04-27 ENCOUNTER — Other Ambulatory Visit: Payer: BC Managed Care – PPO

## 2019-05-10 ENCOUNTER — Other Ambulatory Visit: Payer: BC Managed Care – PPO

## 2019-05-24 ENCOUNTER — Ambulatory Visit
Admission: RE | Admit: 2019-05-24 | Discharge: 2019-05-24 | Disposition: A | Discharge status: Home / Self Care | Payer: BC Managed Care – PPO | Source: Ambulatory Visit | Attending: Radiology | Admitting: Radiology

## 2019-05-24 ENCOUNTER — Other Ambulatory Visit: Payer: Self-pay

## 2019-05-24 DIAGNOSIS — N6310 Unspecified lump in the right breast, unspecified quadrant: Secondary | ICD-10-CM

## 2019-07-27 ENCOUNTER — Institutional Professional Consult (permissible substitution): Payer: BC Managed Care – PPO | Admitting: Pulmonary Disease

## 2020-08-21 ENCOUNTER — Other Ambulatory Visit: Payer: Self-pay | Admitting: Obstetrics and Gynecology

## 2020-08-21 DIAGNOSIS — R928 Other abnormal and inconclusive findings on diagnostic imaging of breast: Secondary | ICD-10-CM

## 2020-09-11 ENCOUNTER — Ambulatory Visit
Admission: RE | Admit: 2020-09-11 | Discharge: 2020-09-11 | Disposition: A | Discharge status: Home / Self Care | Payer: BC Managed Care – PPO | Source: Ambulatory Visit | Attending: Obstetrics and Gynecology | Admitting: Obstetrics and Gynecology

## 2020-09-11 ENCOUNTER — Other Ambulatory Visit: Payer: Self-pay

## 2020-09-11 ENCOUNTER — Ambulatory Visit
Admission: RE | Admit: 2020-09-11 | Discharge: 2020-09-11 | Disposition: A | Discharge status: Home / Self Care | Payer: Self-pay | Source: Ambulatory Visit | Attending: Obstetrics and Gynecology | Admitting: Obstetrics and Gynecology

## 2020-09-11 DIAGNOSIS — R928 Other abnormal and inconclusive findings on diagnostic imaging of breast: Secondary | ICD-10-CM

## 2022-08-07 ENCOUNTER — Encounter: Payer: Self-pay | Admitting: Internal Medicine

## 2022-08-07 ENCOUNTER — Ambulatory Visit: Payer: BC Managed Care – PPO | Admitting: Internal Medicine

## 2022-08-07 VITALS — BP 126/82 | HR 98 | Resp 16 | Ht 63.0 in | Wt 281.0 lb

## 2022-08-07 DIAGNOSIS — J302 Other seasonal allergic rhinitis: Secondary | ICD-10-CM | POA: Diagnosis not present

## 2022-08-07 DIAGNOSIS — J3089 Other allergic rhinitis: Secondary | ICD-10-CM

## 2022-08-07 DIAGNOSIS — H1045 Other chronic allergic conjunctivitis: Secondary | ICD-10-CM

## 2022-08-07 DIAGNOSIS — K219 Gastro-esophageal reflux disease without esophagitis: Secondary | ICD-10-CM

## 2022-08-07 DIAGNOSIS — Z91018 Allergy to other foods: Secondary | ICD-10-CM

## 2022-08-07 DIAGNOSIS — T781XXA Other adverse food reactions, not elsewhere classified, initial encounter: Secondary | ICD-10-CM | POA: Insufficient documentation

## 2022-08-07 MED ORDER — MONTELUKAST SODIUM 10 MG PO TABS: 10.0000 mg | ORAL_TABLET | Freq: Every day | ORAL | 5 refills | Status: DC

## 2022-08-07 NOTE — Patient Instructions (Addendum)
Chronic Rhinitis Seasonal and Perennial Allergic: not well controlled - allergy testing today: Grass, weed, tree, mold, dust mite  - Prevention:  - allergen avoidance when possible Start allergy injections. To prevent reactions, increase cetirizine to twice daily dosing around allergy injections  Had a detailed discussion with patient/family that clinical history is suggestive of allergic rhinitis, and may benefit from allergy immunotherapy (AIT). Discussed in detail regarding the dosing, schedule, side effects (mild to moderate local allergic reaction and rarely systemic allergic reactions including anaphylaxis/death), alternatives and benefits (significant improvement in nasal symptoms, seasonal flares of asthma) of immunotherapy with the patient. There is significant time commitment involved with allergy shots, which includes weekly immunotherapy injections for first 9-12 months and then biweekly to monthly injections for 3-5 years. Clinical response is often delayed and patient may not see an improvement for 6-12 months. Consent was signed. I have prescribed epinephrine injectable and demonstrated proper use. For mild symptoms you can take over the counter antihistamines such as Benadryl and monitor symptoms closely. If symptoms worsen or if you have severe symptoms including breathing issues, throat closure, significant swelling, whole body hives, severe diarrhea and vomiting, lightheadedness then inject epinephrine and seek immediate medical care afterwards. Action plan given.   - Symptom control: - Continue Nasal Steroid Spray: Best results if used daily. - Options include Flonase (fluticasone), Nasocort (triamcinolone), Nasonex (mometasome) 1- 2 sprays in each nostril daily.  - All can be purchased over-the-counter if not covered by insurance. - Use less frequently if airway gets too dry. - Start Singulair (Montelukast) 10mg  nightly.   - Discontinue if nightmares of behavior changes. -  Continue Antihistamine: daily or daily as needed.   -Options include Zyrtec (Cetirizine) 10mg , Claritin (Loratadine) 10mg , Allegra (Fexofenadine) 180mg , or Xyzal (Levocetirinze) 5mg  - Can be purchased over-the-counter if not covered by insurance.  Allergic Conjunctivitis:  - Start Allergy Eye drops-great options include Pataday (Olopatadine) or Zaditor (ketotifen) for eye symptoms daily as needed-both sold over the counter if not covered by insurance. and Rewetting Drops such as Systane,TheraTears, etc  -Avoid eye drops that say red eye relief as they may contain medications that dry out your eyes.   Oral Allergy Syndrome: watermelon  - Your symptoms are not consistent with true food allergies, and are more likely to be due to oral allergy syndrome. - These symptoms are not life-threatening and are because of a cross reaction between a pollen you are allergic to, and to a protein in specific foods (such as fresh fruits, vegetables, and nuts). - If you can eat these things it is fine to continue to do so.  If not, you may avoid these fresh fruits.  Heating these foods should allow them to be consumed without symptoms. - You may notice increase in symptoms during allergy season, this is to be expected. - Allergy  Immunotherapy can help lessen and some cases cure these symptoms and should be considered if they worsen.     Follow up: 6 months in clinic  Follow up: for 3-4 weeks for first shot appointment   Thank you so much for letting me partake in your care today.  Don't hesitate to reach out if you have any additional concerns!  Ferol Luz, MD  [facility] [place] [place]  Reducing Pollen Exposure  The American Academy of Allergy, Asthma and Immunology suggests the following steps to reduce your exposure to pollen during allergy seasons.    Do not hang sheets or clothing out to dry;  pollen may collect on these items. Do not mow lawns or spend time around freshly cut  grass; mowing stirs up pollen. Keep windows closed at night.  Keep car windows closed while driving. Minimize morning activities outdoors, a time when pollen counts are usually at their highest. Stay indoors as much as possible when pollen counts or humidity is high and on windy days when pollen tends to remain in the air longer. Use air conditioning when possible.  Many air conditioners have filters that trap the pollen spores. Use a HEPA room air filter to remove pollen form the indoor air you breathe.  DUST MITE AVOIDANCE MEASURES:  There are three main measures that need and can be taken to avoid house dust mites:  Reduce accumulation of dust in general -reduce furniture, clothing, carpeting, books, stuffed animals, especially in bedroom  Separate yourself from the dust -use pillow and mattress encasements (can be found at stores such as [employer], and [employer] or online) -avoid direct exposure to air condition flow -use a HEPA filter device, especially in the bedroom; you can also use a HEPA filter vacuum cleaner -wipe dust with a moist towel instead of a dry towel or broom when cleaning  Decrease mites and/or their secretions -wash clothing and linen and stuffed animals at highest temperature possible, at least every 2 weeks -stuffed animals can also be placed in a bag and put in a freezer overnight  Despite the above measures, it is impossible to eliminate dust mites or their allergen completely from your home.  With the above measures the burden of mites in your home can be diminished, with the goal of minimizing your allergic symptoms.  Success will be reached only when implementing and using all means together.  Control of Mold Allergen   Mold and fungi can grow on a variety of surfaces provided certain temperature and moisture conditions exist.  Outdoor molds grow on plants, decaying vegetation and soil.  The major outdoor mold, Alternaria and Cladosporium, are found in very high  numbers during hot and dry conditions.  Generally, a late Summer - Fall peak is seen for common outdoor fungal spores.  Rain will temporarily lower outdoor mold spore count, but counts rise rapidly when the rainy period ends.  The most important indoor molds are Aspergillus and Penicillium.  Dark, humid and poorly ventilated basements are ideal sites for mold growth.  The next most common sites of mold growth are the bathroom and the kitchen.  Outdoor (Seasonal) Mold Control  Use air conditioning and keep windows closed Avoid exposure to decaying vegetation. Avoid leaf raking. Avoid grain handling. Consider wearing a face mask if working in moldy areas.    Indoor (Perennial) Mold Control    Maintain humidity below 50%. Clean washable surfaces with 5% bleach solution. Remove sources e.g. contaminated carpets.

## 2022-08-07 NOTE — Progress Notes (Unsigned)
New Patient Note  RE: Angel Richmond MRN: [id number] DOB: [birth year] Date of Office Visit: 08/07/2022  Consult requested by: Doreene Eland, MD Primary care provider: Hurshel Party, NP  Chief Complaint: Allergies and Pruritis  History of Present Illness: I had the pleasure of seeing Angel Richmond for initial evaluation at the [facility] [place] on 08/10/2022. She is a 42 y.o. female, who is referred here by Gaye Alken A, NP for the evaluation of rhinitis, pruritus .  History obtained from patient, chart review.  Chronic rhinitis: started around age 49 years  Symptoms include:  generalized itch , nasal congestion, rhinorrhea, post nasal drainage, sneezing, watery eyes, itchy eyes, and itchy nose  Occurs seasonally-spring and fall  Potential triggers: pollen, denies animal symptoms  Treatments tried: cetirizine, flonase, claritin (no benefit), allegra  Did try AIT for awhile, but had to stop due to reaction: developed panic after injection and felt like sometime it made her symptoms worse - this was through [employer] allergy Previous allergy testing:  10 years ago "allergic to everything"  History of reflux/heartburn: yes: omeprazole as needed (1-2 times per week)  History of chronic sinusitis or sinus surgery: no Nonallergic triggers:  strong odors, cold air     Watermelon causes mouth itch.  Tolerates processed forms.    Assessment and Plan: Angel Richmond is a 42 y.o. female with: Seasonal and perennial allergic rhinitis - Plan: Allergy Test, Interdermal Allergy Test, Allergen Immunotherapy, Allergen Immunotherapy  Other chronic allergic conjunctivitis of both eyes  Pollen-food allergy, initial encounter  Gastroesophageal reflux disease without esophagitis   Plan: Patient Instructions  Chronic Rhinitis Seasonal and Perennial Allergic: not well controlled - allergy testing today: Grass, weed, tree, mold, dust mite  - Prevention:  - allergen avoidance when  possible Start allergy injections. To prevent reactions, increase cetirizine to twice daily dosing around allergy injections  Had a detailed discussion with patient/family that clinical history is suggestive of allergic rhinitis, and may benefit from allergy immunotherapy (AIT). Discussed in detail regarding the dosing, schedule, side effects (mild to moderate local allergic reaction and rarely systemic allergic reactions including anaphylaxis/death), alternatives and benefits (significant improvement in nasal symptoms, seasonal flares of asthma) of immunotherapy with the patient. There is significant time commitment involved with allergy shots, which includes weekly immunotherapy injections for first 9-12 months and then biweekly to monthly injections for 3-5 years. Clinical response is often delayed and patient may not see an improvement for 6-12 months. Consent was signed. I have prescribed epinephrine injectable and demonstrated proper use. For mild symptoms you can take over the counter antihistamines such as Benadryl and monitor symptoms closely. If symptoms worsen or if you have severe symptoms including breathing issues, throat closure, significant swelling, whole body hives, severe diarrhea and vomiting, lightheadedness then inject epinephrine and seek immediate medical care afterwards. Action plan given.   - Symptom control: - Continue Nasal Steroid Spray: Best results if used daily. - Options include Flonase (fluticasone), Nasocort (triamcinolone), Nasonex (mometasome) 1- 2 sprays in each nostril daily.  - All can be purchased over-the-counter if not covered by insurance. - Use less frequently if airway gets too dry. - Start Singulair (Montelukast) 10mg  nightly.   - Discontinue if nightmares of behavior changes. - Continue Antihistamine: daily or daily as needed.   -Options include Zyrtec (Cetirizine) 10mg , Claritin (Loratadine) 10mg , Allegra (Fexofenadine) 180mg , or Xyzal (Levocetirinze)  5mg  - Can be purchased over-the-counter if not covered by insurance.  Allergic Conjunctivitis:  -  Start Allergy Eye drops-great options include Pataday (Olopatadine) or Zaditor (ketotifen) for eye symptoms daily as needed-both sold over the counter if not covered by insurance. and Rewetting Drops such as Systane,TheraTears, etc  -Avoid eye drops that say red eye relief as they may contain medications that dry out your eyes.   Oral Allergy Syndrome: watermelon  - Your symptoms are not consistent with true food allergies, and are more likely to be due to oral allergy syndrome. - These symptoms are not life-threatening and are because of a cross reaction between a pollen you are allergic to, and to a protein in specific foods (such as fresh fruits, vegetables, and nuts). - If you can eat these things it is fine to continue to do so.  If not, you may avoid these fresh fruits.  Heating these foods should allow them to be consumed without symptoms. - You may notice increase in symptoms during allergy season, this is to be expected. - Allergy  Immunotherapy can help lessen and some cases cure these symptoms and should be considered if they worsen.     Follow up: 6 months in clinic  Follow up: for 3-4 weeks for first shot appointment   Thank you so much for letting me partake in your care today.  Don't hesitate to reach out if you have any additional concerns!  Ferol Luz, MD  [facility] [place], [place]  Reducing Pollen Exposure  The American Academy of Allergy, Asthma and Immunology suggests the following steps to reduce your exposure to pollen during allergy seasons.    Do not hang sheets or clothing out to dry; pollen may collect on these items. Do not mow lawns or spend time around freshly cut grass; mowing stirs up pollen. Keep windows closed at night.  Keep car windows closed while driving. Minimize morning activities outdoors, a time when pollen counts are usually  at their highest. Stay indoors as much as possible when pollen counts or humidity is high and on windy days when pollen tends to remain in the air longer. Use air conditioning when possible.  Many air conditioners have filters that trap the pollen spores. Use a HEPA room air filter to remove pollen form the indoor air you breathe.  DUST MITE AVOIDANCE MEASURES:  There are three main measures that need and can be taken to avoid house dust mites:  Reduce accumulation of dust in general -reduce furniture, clothing, carpeting, books, stuffed animals, especially in bedroom  Separate yourself from the dust -use pillow and mattress encasements (can be found at stores such as [employer], [employer], [employer] or online) -avoid direct exposure to air condition flow -use a HEPA filter device, especially in the bedroom; you can also use a HEPA filter vacuum cleaner -wipe dust with a moist towel instead of a dry towel or broom when cleaning  Decrease mites and/or their secretions -wash clothing and linen and stuffed animals at highest temperature possible, at least every 2 weeks -stuffed animals can also be placed in a bag and put in a freezer overnight  Despite the above measures, it is impossible to eliminate dust mites or their allergen completely from your home.  With the above measures the burden of mites in your home can be diminished, with the goal of minimizing your allergic symptoms.  Success will be reached only when implementing and using all means together.  Control of Mold Allergen   Mold and fungi can grow on a variety of surfaces provided certain  temperature and moisture conditions exist.  Outdoor molds grow on plants, decaying vegetation and soil.  The major outdoor mold, Alternaria and Cladosporium, are found in very high numbers during hot and dry conditions.  Generally, a late Summer - Fall peak is seen for common outdoor fungal spores.  Rain will temporarily lower outdoor mold spore count, but  counts rise rapidly when the rainy period ends.  The most important indoor molds are Aspergillus and Penicillium.  Dark, humid and poorly ventilated basements are ideal sites for mold growth.  The next most common sites of mold growth are the bathroom and the kitchen.  Outdoor (Seasonal) Mold Control  Use air conditioning and keep windows closed Avoid exposure to decaying vegetation. Avoid leaf raking. Avoid grain handling. Consider wearing a face mask if working in moldy areas.    Indoor (Perennial) Mold Control    Maintain humidity below 50%. Clean washable surfaces with 5% bleach solution. Remove sources e.g. contaminated carpets.     Meds ordered this encounter  Medications   montelukast (SINGULAIR) 10 MG tablet    Sig: Take 1 tablet (10 mg total) by mouth at bedtime.    Dispense:  30 tablet    Refill:  5   Lab Orders  No laboratory test(s) ordered today    Other allergy screening: Asthma: no Rhino conjunctivitis: yes Food allergy: yes Medication allergy: no Hymenoptera allergy: no Urticaria: no Eczema:no History of recurrent infections suggestive of immunodeficency: no  Diagnostics: Skin Testing: Environmental allergy panel and select foods. Results interpreted by myself and discussed with patient/family.  Airborne Adult Perc - 08/07/22 1422     Time Antigen Placed 1422    Allergen Manufacturer Waynette Buttery    Location Back    Number of Test 59    Panel 1 Select    1. Control-Buffer 50% Glycerol Negative    2. Control-Histamine 1 mg/ml 4+    3. Albumin saline Negative    4. Bahia 4+    5. [place] 4+    6. Johnson 4+    7. [place] Blue 3+    8. Meadow Fescue Negative    9. Perennial Rye 4+    10. Sweet Vernal 4+    11. Timothy 4+    12. Cocklebur 4+    13. Burweed [place] 4+    14. Ragweed, short 4+    15. Ragweed, Giant 4+    16. Plantain,  English 4+    17. Lamb's Quarters 4+    18. Sheep Sorrell 4+    19. Rough Pigweed 4+    20. Marsh Elder,  Rough 4+    21. Mugwort, Common 4+    22. Ash mix 4+    23. Birch mix 4+    24. Beech American 4+    25. Box, Elder 4+    26. Cedar, red 4+    27. Cottonwood, Eastern 4+    28. Elm mix 4+    29. Hickory 4+    30. Maple mix 4+    31. Oak, [place] mix 4+    32. Pecan Pollen 4+    33. Pine mix 4+    34. Sycamore Eastern 4+    35. Walnut, Black Pollen 4+    36. Alternaria alternata 3+    37. Cladosporium Herbarum Negative    38. Aspergillus mix Negative    39. Penicillium mix Negative    40. Bipolaris sorokiniana (Helminthosporium) Negative    41. Drechslera spicifera (Curvularia) Negative  42. Mucor plumbeus Negative    43. Fusarium moniliforme Negative    44. Aureobasidium pullulans (pullulara) Negative    45. Rhizopus oryzae Negative    46. Botrytis cinera Negative    47. Epicoccum nigrum Negative    48. Phoma betae Negative    49. Candida Albicans Negative    50. Trichophyton mentagrophytes Negative    51. Mite, D Farinae  5,000 AU/ml Negative    52. Mite, D Pteronyssinus  5,000 AU/ml Negative    53. Cat Hair 10,000 BAU/ml Negative    54.  Dog Epithelia Negative    55. Mixed Feathers Negative    56. Horse Epithelia Negative    57. Cockroach, German Negative    58. Mouse Negative    59. Tobacco Leaf Negative             Intradermal - 08/07/22 1428     Time Antigen Placed 1428    Allergen Manufacturer Greer    Location Arm    Number of Test 8    Intradermal Select    Control Negative    Mold 2 Negative    Mold 3 Negative    Mold 4 3+    Cat Negative    Dog Negative    Cockroach Negative    Mite mix 4+             Food Adult Perc - 08/07/22 1400     Time Antigen Placed 1422    Allergen Manufacturer Waynette Buttery    Location Back    Number of allergen test 1    62. Watermelon Negative             Past Medical History: Patient Active Problem List   Diagnosis Date Noted   Seasonal and perennial allergic rhinitis 08/07/2022   Pollen-food allergy  08/07/2022   Conjunctivitis 12/20/2014   Past Medical History:  Diagnosis Date   IBS (irritable bowel syndrome)    Thyroid disease    Past Surgical History: Past Surgical History:  Procedure Laterality Date   CESAREAN SECTION     CHOLECYSTECTOMY     OVARIAN CYST REMOVAL     Medication List:  Current Outpatient Medications  Medication Sig Dispense Refill   FLUoxetine (PROZAC) 40 MG capsule Take 1 tablet by mouth daily.     levothyroxine (SYNTHROID, LEVOTHROID) 125 MCG tablet Take 125 mcg by mouth daily before breakfast.     LO LOESTRIN FE 1 MG-10 MCG / 10 MCG tablet Take 1 tablet by mouth daily.     montelukast (SINGULAIR) 10 MG tablet Take 1 tablet (10 mg total) by mouth at bedtime. 30 tablet 5   traZODone (DESYREL) 50 MG tablet Take 75 mg by mouth at bedtime.     No current facility-administered medications for this visit.   Allergies: No Known Allergies Social History: Social History   Socioeconomic History   Marital status: Married    Spouse name: Not on file   Number of children: Not on file   Years of education: Not on file   Highest education level: Not on file  Occupational History   Not on file  Tobacco Use   Smoking status: Never   Smokeless tobacco: Not on file  Substance and Sexual Activity   Alcohol use: No   Drug use: No   Sexual activity: Not on file  Other Topics Concern   Not on file  Social History Narrative   Not on file   Social Determinants of Health  Financial Resource Strain: Not on file  Food Insecurity: Not on file  Transportation Needs: Not on file  Physical Activity: Not on file  Stress: Not on file  Social Connections: Not on file   Lives in a [facility], no roaches in the house, bed is 2 feet off the floor. No DM precautions . Smoking: no exposure  Occupation: [employer]  Environmental HistorySurveyor, minerals in the house: no Engineer, civil (consulting) in the family room: no Carpet in the bedroom: yes Heating: electric Cooling: central Pet:  no  Family History: History reviewed. No pertinent family history.   ROS: All others negative except as noted per HPI.   Objective: BP 126/82   Pulse 98   Resp 16   Ht 5\' 3"  (1.6 m)   Wt 281 lb (127.5 kg)   SpO2 99%   BMI 49.78 kg/m  Body mass index is 49.78 kg/m.  General Appearance:  Alert, cooperative, no distress, appears stated age  Head:  Normocephalic, without obvious abnormality, atraumatic  Eyes:  Conjunctiva clear, EOM's intact  Nose: Nares normal,  erythematous nasal mucosa , no visible anterior polyps, and septum midline  Throat: Lips, tongue normal; teeth and gums normal, + cobblestoning  Neck: Supple, symmetrical  Lungs:   clear to auscultation bilaterally, Respirations unlabored, no coughing  Heart:  regular rate and rhythm and no murmur, Appears well perfused  Extremities: No edema  Skin: Skin color, texture, turgor normal, no rashes or lesions on visualized portions of skin  Neurologic: No gross deficits   The plan was reviewed with the patient/family, and all questions/concerned were addressed.  It was my pleasure to see Angel Richmond today and participate in her care. Please feel free to contact me with any questions or concerns.  Sincerely,  Ferol Luz, MD Allergy & Immunology  [facility] office: [phone] [facility] office: [phone]

## 2022-08-10 DIAGNOSIS — J301 Allergic rhinitis due to pollen: Secondary | ICD-10-CM | POA: Diagnosis not present

## 2022-08-10 NOTE — Progress Notes (Signed)
Aeroallergen Immunotherapy   Ordering Provider: Dr. Ferol Luz   Patient Details  Name: Angel Richmond  MRN: [id number]  Date of Birth: 17-Feb-1981   Order 2 of 2   Vial Label: DM-M   0.2 ml (Volume)  1:20 Concentration -- Alternaria alternata  0.2 ml (Volume)  1:10 Concentration -- Fusarium moniliforme  0.2 ml (Volume)  1:40 Concentration -- Aureobasidium pullulans  0.2 ml (Volume)  1:10 Concentration -- Rhizopus oryzae  0.5 ml (Volume)   AU Concentration -- Mite Mix (DF 5,000 & DP 5,000)    1.3  ml Extract Subtotal  3.7  ml Diluent  5.0  ml Maintenance Total   Schedule:   Silver Vial (1:1,000,000): Schedule B (6 doses)  Blue Vial (1:100,000): Schedule B (6 doses)  Yellow Vial (1:10,000): Schedule B (6 doses)  Green Vial (1:1,000): Schedule B (6 doses)  Red Vial (1:100): Schedule A (10 doses)   Special Instructions: None

## 2022-08-10 NOTE — Progress Notes (Signed)
VIALS EXP 08-10-23 

## 2022-08-10 NOTE — Progress Notes (Signed)
Aeroallergen Immunotherapy   Ordering Provider: Dr. Ferol Luz   Patient Details  Name: Angel Richmond  MRN: [id number]  Date of Birth: 1980-09-02   Order 1 of 1   Vial Label: T-G-W   0.3 ml (Volume)  BAU Concentration -- 7 Grass Mix* 100,000 ([address] [place], [place], [place], [place] Rye, RedTop, Sweet Vernal, Timothy)  0.2 ml (Volume)  1:20 Concentration -- Bahia  0.3 ml (Volume)  BAU Concentration -- [place] 10,000  0.2 ml (Volume)  1:20 Concentration -- Johnson  0.3 ml (Volume)  1:20 Concentration -- Ragweed Mix  0.2 ml (Volume)  1:20 Concentration -- Cocklebur  0.2 ml (Volume)  1:10 Concentration -- Plantain English  0.5 ml (Volume)  1:20 Concentration -- Weed Mix*  0.5 ml (Volume)  1:20 Concentration -- Eastern 10 Tree Mix (also Sweet Gum)  0.2 ml (Volume)  1:20 Concentration -- Box Elder  0.2 ml (Volume)  1:10 Concentration -- Cedar, red  0.2 ml (Volume)  1:10 Concentration -- Pecan Pollen  0.2 ml (Volume)  1:10 Concentration -- Pine Mix  0.2 ml (Volume)  1:20 Concentration -- Walnut, Black Pollen    3.7  ml Extract Subtotal  1.3  ml Diluent  5.0  ml Maintenance Total   Schedule:   Silver Vial (1:1,000,000): Schedule B (6 doses)  Blue Vial (1:100,000): Schedule B (6 doses)  Yellow Vial (1:10,000): Schedule B (6 doses)  Green Vial (1:1,000): Schedule B (6 doses)  Red Vial (1:100): Schedule A (10 doses)   Special Instructions: none

## 2022-08-11 DIAGNOSIS — J302 Other seasonal allergic rhinitis: Secondary | ICD-10-CM | POA: Diagnosis not present

## 2022-08-27 ENCOUNTER — Ambulatory Visit (INDEPENDENT_AMBULATORY_CARE_PROVIDER_SITE_OTHER): Payer: BC Managed Care – PPO | Admitting: *Deleted

## 2022-08-27 DIAGNOSIS — J309 Allergic rhinitis, unspecified: Secondary | ICD-10-CM

## 2022-08-27 MED ORDER — EPINEPHRINE 0.3 MG/0.3ML IJ SOAJ: INTRAMUSCULAR | 3 refills | Status: DC

## 2022-08-27 NOTE — Progress Notes (Signed)
Immunotherapy   Patient Details  Name: Angel Richmond MRN: [id number] Date of Birth: [birth year]  08/27/2022  [name] started injections for  DM/M & T/G/W Following schedule: B  Frequency:1 time per week Epi-Pen:Prescription for Epi-Pen given Consent signed and patient instructions given.   [name] 08/27/2022, 4:22 PM

## 2022-09-03 ENCOUNTER — Ambulatory Visit (INDEPENDENT_AMBULATORY_CARE_PROVIDER_SITE_OTHER): Payer: BC Managed Care – PPO | Admitting: *Deleted

## 2022-09-03 DIAGNOSIS — J309 Allergic rhinitis, unspecified: Secondary | ICD-10-CM | POA: Diagnosis not present

## 2022-09-17 ENCOUNTER — Ambulatory Visit (INDEPENDENT_AMBULATORY_CARE_PROVIDER_SITE_OTHER): Payer: BC Managed Care – PPO | Admitting: *Deleted

## 2022-09-17 DIAGNOSIS — J309 Allergic rhinitis, unspecified: Secondary | ICD-10-CM

## 2022-09-23 ENCOUNTER — Ambulatory Visit (INDEPENDENT_AMBULATORY_CARE_PROVIDER_SITE_OTHER): Payer: BC Managed Care – PPO

## 2022-09-23 DIAGNOSIS — J309 Allergic rhinitis, unspecified: Secondary | ICD-10-CM

## 2022-09-30 ENCOUNTER — Ambulatory Visit (INDEPENDENT_AMBULATORY_CARE_PROVIDER_SITE_OTHER): Payer: BC Managed Care – PPO | Admitting: *Deleted

## 2022-09-30 DIAGNOSIS — J309 Allergic rhinitis, unspecified: Secondary | ICD-10-CM | POA: Diagnosis not present

## 2022-10-08 ENCOUNTER — Ambulatory Visit (INDEPENDENT_AMBULATORY_CARE_PROVIDER_SITE_OTHER): Payer: BC Managed Care – PPO | Admitting: *Deleted

## 2022-10-08 DIAGNOSIS — J309 Allergic rhinitis, unspecified: Secondary | ICD-10-CM | POA: Diagnosis not present

## 2022-10-14 ENCOUNTER — Ambulatory Visit (INDEPENDENT_AMBULATORY_CARE_PROVIDER_SITE_OTHER): Payer: BC Managed Care – PPO | Admitting: *Deleted

## 2022-10-14 DIAGNOSIS — J309 Allergic rhinitis, unspecified: Secondary | ICD-10-CM

## 2022-10-22 ENCOUNTER — Ambulatory Visit (INDEPENDENT_AMBULATORY_CARE_PROVIDER_SITE_OTHER): Payer: BC Managed Care – PPO | Admitting: *Deleted

## 2022-10-22 DIAGNOSIS — J309 Allergic rhinitis, unspecified: Secondary | ICD-10-CM

## 2022-11-11 ENCOUNTER — Ambulatory Visit (INDEPENDENT_AMBULATORY_CARE_PROVIDER_SITE_OTHER): Payer: Self-pay | Admitting: *Deleted

## 2022-11-11 DIAGNOSIS — J309 Allergic rhinitis, unspecified: Secondary | ICD-10-CM

## 2022-11-18 ENCOUNTER — Ambulatory Visit (INDEPENDENT_AMBULATORY_CARE_PROVIDER_SITE_OTHER): Payer: BC Managed Care – PPO

## 2022-11-18 DIAGNOSIS — J309 Allergic rhinitis, unspecified: Secondary | ICD-10-CM | POA: Diagnosis not present

## 2022-12-09 ENCOUNTER — Ambulatory Visit (INDEPENDENT_AMBULATORY_CARE_PROVIDER_SITE_OTHER): Payer: BC Managed Care – PPO | Admitting: *Deleted

## 2022-12-09 DIAGNOSIS — J309 Allergic rhinitis, unspecified: Secondary | ICD-10-CM | POA: Diagnosis not present

## 2022-12-16 ENCOUNTER — Ambulatory Visit (INDEPENDENT_AMBULATORY_CARE_PROVIDER_SITE_OTHER): Payer: Self-pay | Admitting: *Deleted

## 2022-12-16 DIAGNOSIS — J309 Allergic rhinitis, unspecified: Secondary | ICD-10-CM

## 2022-12-22 ENCOUNTER — Ambulatory Visit (INDEPENDENT_AMBULATORY_CARE_PROVIDER_SITE_OTHER): Payer: BC Managed Care – PPO

## 2022-12-22 DIAGNOSIS — J309 Allergic rhinitis, unspecified: Secondary | ICD-10-CM

## 2022-12-28 ENCOUNTER — Ambulatory Visit (INDEPENDENT_AMBULATORY_CARE_PROVIDER_SITE_OTHER): Payer: BC Managed Care – PPO

## 2022-12-28 DIAGNOSIS — J309 Allergic rhinitis, unspecified: Secondary | ICD-10-CM

## 2023-01-05 ENCOUNTER — Ambulatory Visit (INDEPENDENT_AMBULATORY_CARE_PROVIDER_SITE_OTHER): Payer: BC Managed Care – PPO

## 2023-01-05 DIAGNOSIS — J309 Allergic rhinitis, unspecified: Secondary | ICD-10-CM

## 2023-01-20 ENCOUNTER — Ambulatory Visit (INDEPENDENT_AMBULATORY_CARE_PROVIDER_SITE_OTHER): Payer: BC Managed Care – PPO

## 2023-01-20 DIAGNOSIS — J309 Allergic rhinitis, unspecified: Secondary | ICD-10-CM | POA: Diagnosis not present

## 2023-01-27 ENCOUNTER — Ambulatory Visit (INDEPENDENT_AMBULATORY_CARE_PROVIDER_SITE_OTHER): Payer: Self-pay | Admitting: *Deleted

## 2023-01-27 DIAGNOSIS — J309 Allergic rhinitis, unspecified: Secondary | ICD-10-CM

## 2023-02-03 ENCOUNTER — Other Ambulatory Visit: Payer: Self-pay | Admitting: Internal Medicine

## 2023-02-05 ENCOUNTER — Ambulatory Visit: Payer: BC Managed Care – PPO | Admitting: Internal Medicine

## 2023-03-03 ENCOUNTER — Encounter: Payer: Self-pay | Admitting: Allergy and Immunology

## 2023-03-03 ENCOUNTER — Ambulatory Visit: Payer: BC Managed Care – PPO | Admitting: Allergy and Immunology

## 2023-03-03 VITALS — BP 142/92 | HR 100 | Resp 14 | Ht 62.5 in | Wt 294.8 lb

## 2023-03-03 DIAGNOSIS — T781XXD Other adverse food reactions, not elsewhere classified, subsequent encounter: Secondary | ICD-10-CM

## 2023-03-03 DIAGNOSIS — H1045 Other chronic allergic conjunctivitis: Secondary | ICD-10-CM | POA: Diagnosis not present

## 2023-03-03 DIAGNOSIS — J302 Other seasonal allergic rhinitis: Secondary | ICD-10-CM | POA: Diagnosis not present

## 2023-03-03 DIAGNOSIS — J3089 Other allergic rhinitis: Secondary | ICD-10-CM | POA: Diagnosis not present

## 2023-03-03 DIAGNOSIS — J309 Allergic rhinitis, unspecified: Secondary | ICD-10-CM

## 2023-03-03 NOTE — Patient Instructions (Signed)
  1.  Continue immunotherapy  2.  Continue montelukast 10 mg daily  3.  If needed:   A. Epi-pen, benadryl, MD/ER evaluation for allergic reaction  B. Cetirizine 10 mg - 1 tablet 1 time per day  C. Pataday - 1 drop each eye 1 time per day  D. OTC Nasacort - 2 sprays each nostril 1 time per day (takes days to work)  4. Return to clinic in 1 year or earlier if problem

## 2023-03-03 NOTE — Progress Notes (Unsigned)
[place] [facility] [place] - [place] - [place] - [place]   Follow-up Note  Referring Provider: Hurshel Party, NP Primary Provider: Hurshel Party, NP Date of Office Visit: 03/03/2023  Subjective:   Angel Richmond (DOB: March 03, 1981) is a 42 y.o. female who [facility] in re-evaluation of the following:  HPI: Angel Richmond returns to this clinic in evaluation of allergic rhinoconjunctivitis and oral allergy syndrome.  I have never seen her in this clinic and her last visit with Dr. Marlynn Perking for her initial evaluation was 07 Aug 2022.  She has been using immunotherapy currently at every week without any adverse effect.  This therapy has definitely resulted in significant improvement regarding her atopic respiratory tract disease while she continues to also use montelukast on a consistent basis.  She has not required a systemic steroid or an antibiotic for any type of airway issue.  She remains away from eating watermelon.  She does not receive the flu vaccine.  Allergies as of 03/03/2023   No Known Allergies      Medication List    cetirizine 10 MG tablet Commonly known as: ZYRTEC Take 10 mg by mouth daily.   EPINEPHrine 0.3 mg/0.3 mL Soaj injection Commonly known as: EPI-PEN Use as directed for life threatening allergic reactions   FLUoxetine 40 MG capsule Commonly known as: PROZAC Take 1 tablet by mouth daily.   levothyroxine 125 MCG tablet Commonly known as: SYNTHROID Take 125 mcg by mouth daily before breakfast.   montelukast 10 MG tablet Commonly known as: SINGULAIR TAKE 1 TABLET BY MOUTH EVERYDAY AT BEDTIME   PATADAY OP Apply to eye daily as needed.   traZODone 50 MG tablet Commonly known as: DESYREL Take 75 mg by mouth at bedtime.    Past Medical History:  Diagnosis Date   Allergic rhinitis    IBS (irritable bowel syndrome)    Oral allergy syndrome    Thyroid disease     Past Surgical History:  Procedure  Laterality Date   CESAREAN SECTION     CHOLECYSTECTOMY     OVARIAN CYST REMOVAL      Review of systems negative except as noted in HPI / PMHx or noted below:  Review of Systems  Constitutional: Negative.   HENT: Negative.    Eyes: Negative.   Respiratory: Negative.    Cardiovascular: Negative.   Gastrointestinal: Negative.   Genitourinary: Negative.   Musculoskeletal: Negative.   Skin: Negative.   Neurological: Negative.   Endo/Heme/Allergies: Negative.   Psychiatric/Behavioral: Negative.       Objective:   Vitals:   03/03/23 1512 03/03/23 1540  BP: (!) 142/92 (!) 142/92  Pulse: 100   Resp: 14   SpO2: 98%    Height: 5' 2.5" (158.8 cm)  Weight: 294 lb 12.8 oz (133.7 kg)   Physical Exam Constitutional:      Appearance: She is not diaphoretic.  HENT:     Head: Normocephalic.     Right Ear: Tympanic membrane, ear canal and external ear normal.     Left Ear: Tympanic membrane, ear canal and external ear normal.     Nose: Nose normal. No mucosal edema or rhinorrhea.     Mouth/Throat:     Pharynx: Uvula midline. No oropharyngeal exudate.  Eyes:     Conjunctiva/sclera: Conjunctivae normal.  Neck:     Thyroid: No thyromegaly.     Trachea: Trachea normal. No tracheal tenderness or tracheal deviation.  Cardiovascular:  Rate and Rhythm: Normal rate and regular rhythm.     Heart sounds: Normal heart sounds, S1 normal and S2 normal. No murmur heard. Pulmonary:     Effort: No respiratory distress.     Breath sounds: Normal breath sounds. No stridor. No wheezing or rales.  Lymphadenopathy:     Head:     Right side of head: No tonsillar adenopathy.     Left side of head: No tonsillar adenopathy.     Cervical: No cervical adenopathy.  Skin:    Findings: No erythema or rash.     Nails: There is no clubbing.  Neurological:     Mental Status: She is alert.     Diagnostics: none  Assessment and Plan:   1. Seasonal and perennial allergic rhinitis   2. Other  chronic allergic conjunctivitis of both eyes   3. Pollen-food allergy, subsequent encounter   4. Allergic rhinitis, unspecified seasonality, unspecified trigger    1.  Continue immunotherapy  2.  Continue montelukast 10 mg daily  3.  If needed:   A. Epi-pen, benadryl, MD/ER evaluation for allergic reaction  B. Cetirizine 10 mg - 1 tablet 1 time per day  C. Pataday - 1 drop each eye 1 time per day  D. OTC Nasacort - 2 sprays each nostril 1 time per day (takes days to work)  4. Return to clinic in 1 year or earlier if problem  Angel Richmond appears to be doing quite well while she is using immunotherapy to treat her atopic respiratory and conjunctival disease.  She will continue on this form of therapy along with a leukotriene modifier utilized on a consistent basis and she has a selection of agents to be utilized should they be needed in the future.  She will return to this clinic in 1 year or earlier if there is a problem.  Laurette Schimke, MD Allergy / Immunology [facility]

## 2023-03-04 ENCOUNTER — Encounter: Payer: Self-pay | Admitting: Allergy and Immunology

## 2023-04-15 ENCOUNTER — Ambulatory Visit (INDEPENDENT_AMBULATORY_CARE_PROVIDER_SITE_OTHER): Payer: 59 | Admitting: *Deleted

## 2023-04-15 DIAGNOSIS — J309 Allergic rhinitis, unspecified: Secondary | ICD-10-CM | POA: Diagnosis not present

## 2023-06-16 ENCOUNTER — Ambulatory Visit (INDEPENDENT_AMBULATORY_CARE_PROVIDER_SITE_OTHER): Payer: Self-pay

## 2023-06-16 DIAGNOSIS — J309 Allergic rhinitis, unspecified: Secondary | ICD-10-CM | POA: Diagnosis not present

## 2023-06-21 ENCOUNTER — Ambulatory Visit: Payer: Self-pay | Admitting: *Deleted

## 2023-06-21 DIAGNOSIS — J309 Allergic rhinitis, unspecified: Secondary | ICD-10-CM

## 2023-06-24 ENCOUNTER — Ambulatory Visit (INDEPENDENT_AMBULATORY_CARE_PROVIDER_SITE_OTHER): Payer: Self-pay | Admitting: *Deleted

## 2023-06-24 DIAGNOSIS — J309 Allergic rhinitis, unspecified: Secondary | ICD-10-CM

## 2023-06-30 ENCOUNTER — Ambulatory Visit (INDEPENDENT_AMBULATORY_CARE_PROVIDER_SITE_OTHER): Payer: Self-pay

## 2023-06-30 DIAGNOSIS — J309 Allergic rhinitis, unspecified: Secondary | ICD-10-CM | POA: Diagnosis not present

## 2023-07-07 ENCOUNTER — Ambulatory Visit (INDEPENDENT_AMBULATORY_CARE_PROVIDER_SITE_OTHER): Payer: Self-pay

## 2023-07-07 DIAGNOSIS — J309 Allergic rhinitis, unspecified: Secondary | ICD-10-CM

## 2023-07-21 ENCOUNTER — Ambulatory Visit (INDEPENDENT_AMBULATORY_CARE_PROVIDER_SITE_OTHER): Payer: Self-pay

## 2023-07-21 DIAGNOSIS — J309 Allergic rhinitis, unspecified: Secondary | ICD-10-CM | POA: Diagnosis not present

## 2023-07-26 ENCOUNTER — Telehealth: Payer: Self-pay | Admitting: *Deleted

## 2023-07-26 NOTE — Telephone Encounter (Signed)
 She has not had a work up for itching. Only skin testing.

## 2023-07-26 NOTE — Telephone Encounter (Signed)
 Angel Richmond has been itching since before her last injection. After the injections, the itching is worse and has not stopped. She is still taking Montelukast  and cetirizine daily and she is taking benadryl in between. There is not really a defined rash, just a few spots. She has had itching in the past which is why she came here in the beginning. Please advise.

## 2023-07-27 ENCOUNTER — Other Ambulatory Visit: Payer: Self-pay | Admitting: *Deleted

## 2023-07-27 DIAGNOSIS — L299 Pruritus, unspecified: Secondary | ICD-10-CM

## 2023-07-27 DIAGNOSIS — L501 Idiopathic urticaria: Secondary | ICD-10-CM

## 2023-07-27 NOTE — Telephone Encounter (Signed)
 Informed and she will pick up the req.

## 2023-07-27 NOTE — Telephone Encounter (Signed)
 Left a message for Angel Richmond to call back. Lab req is up front ready for her to pick up.

## 2023-08-07 ENCOUNTER — Other Ambulatory Visit: Payer: Self-pay | Admitting: Internal Medicine

## 2023-08-09 NOTE — Telephone Encounter (Signed)
 Refill for Singulair  10 mg x 90 day supply with 1 refill sent to [facility].

## 2023-08-15 ENCOUNTER — Encounter (HOSPITAL_BASED_OUTPATIENT_CLINIC_OR_DEPARTMENT_OTHER): Payer: Self-pay | Admitting: Emergency Medicine

## 2023-08-15 ENCOUNTER — Ambulatory Visit (HOSPITAL_BASED_OUTPATIENT_CLINIC_OR_DEPARTMENT_OTHER): Admission: EM | Admit: 2023-08-15 | Discharge: 2023-08-15 | Disposition: A | Discharge status: Home / Self Care

## 2023-08-15 DIAGNOSIS — R42 Dizziness and giddiness: Secondary | ICD-10-CM

## 2023-08-15 DIAGNOSIS — R0602 Shortness of breath: Secondary | ICD-10-CM | POA: Diagnosis not present

## 2023-08-15 DIAGNOSIS — L509 Urticaria, unspecified: Secondary | ICD-10-CM

## 2023-08-15 MED ORDER — EPINEPHRINE 0.3 MG/0.3ML IJ SOAJ: INTRAMUSCULAR | 0 refills | Status: AC

## 2023-08-15 MED ORDER — ALBUTEROL SULFATE HFA 108 (90 BASE) MCG/ACT IN AERS: 2.0000 | INHALATION_SPRAY | RESPIRATORY_TRACT | 0 refills | Status: AC | PRN

## 2023-08-15 MED ORDER — COMPACT SPACE CHAMBER DEVI: 0 refills | Status: AC

## 2023-08-15 NOTE — ED Provider Notes (Signed)
 [facility]    CSN: [id number] Arrival date & time: 08/15/23  1503      History   Chief Complaint No chief complaint on file.   HPI Angel Richmond is a 43 y.o. female.   Patient reports a rash on her face arms chest and legs intermittently since early April 2024.  She ended up at the allergist and got a steroid shot for the rash.  The rash persisted and she saw primary care and she was given another steroid shot (Kenalog) in mid-late April 2024.  She waited 2 full weeks and felt like she was still having worsening rash, feeling anxious and irritable, having shortness of breath, itching all over.  She was put on prednisone pills and took those for almost a week.  She finished her prednisone on 08/13/2023.  Over the weekend she has felt worse and worse with fatigue, itching, rash, shortness of breath, nervousness and anxiety, dizziness and even tachycardia.  She has been on cetirizine 10 mg and has been told by either her PCP or allergist that she could take up to 40 mg a day.  So Sunday she takes a total of 40 mg of cetirizine.  Montelukast  10 mL he takes daily the pen but she never got it filled and has not ever used it.  She spoke to her allergist (she gets allergy  shots regularly with this allergist) and Dr. Jerelene Monday told her to come in for allergy  testing.  She did not go in for allergy  testing because she thought she could not have testing due to all the medication she was taking.  She is not aware of a specific allergen that is causing this.  She is not aware of any problem with any foods.  She does not ever remember a tick bite.  She is really starting get nervous and anxious about what is going on and why she feels so bad.     Past Medical History:  Diagnosis Date   Allergic rhinitis    IBS (irritable bowel syndrome)    Oral allergy  syndrome    Thyroid disease     Patient Active Problem List   Diagnosis Date Noted   Seasonal and perennial allergic rhinitis  08/07/2022   Pollen-food allergy  08/07/2022   Conjunctivitis 12/20/2014    Past Surgical History:  Procedure Laterality Date   CESAREAN SECTION     CHOLECYSTECTOMY     OVARIAN CYST REMOVAL      OB History   No obstetric history on file.      Home Medications    Prior to Admission medications   Medication Sig Start Date End Date Taking? Authorizing Provider  albuterol (VENTOLIN HFA) 108 (90 Base) MCG/ACT inhaler Inhale 2 puffs into the lungs every 4 (four) hours as needed for wheezing or shortness of breath. 08/15/23  Yes Guss Legacy, FNP  cetirizine (ZYRTEC) 10 MG tablet Take 10 mg by mouth daily.   Yes [provider]  escitalopram (LEXAPRO) 10 MG tablet Take 10 mg by mouth daily. 08/10/23  Yes [provider]  levothyroxine (SYNTHROID, LEVOTHROID) 125 MCG tablet Take 125 mcg by mouth daily before breakfast.   Yes [provider]  montelukast  (SINGULAIR ) 10 MG tablet TAKE 1 TABLET BY MOUTH EVERYDAY AT BEDTIME 08/09/23  Yes Kozlow, Rema Care, MD  Spacer/Aero-Holding Chambers (COMPACT SPACE CHAMBER) DEVI Use with the albuterol inhaler 08/15/23  Yes Guss Legacy, FNP  traZODone (DESYREL) 50 MG tablet Take 75 mg by mouth at bedtime.  06/11/22  Yes [provider]  EPINEPHrine  0.3 mg/0.3 mL IJ SOAJ injection Use as directed for life threatening allergic reactions 08/15/23   Guss Legacy, FNP  FLUoxetine (PROZAC) 40 MG capsule Take 1 tablet by mouth daily. 01/29/20   [provider]  Olopatadine HCl (PATADAY OP) Apply to eye daily as needed.    [provider]    Family History History reviewed. No pertinent family history.  Social History Social History   Tobacco Use   Smoking status: Never  Substance Use Topics   Alcohol use: No   Drug use: No     Allergies   Patient has no known allergies.   Review of Systems Review of Systems  Constitutional:  Positive for fatigue. Negative for chills and fever.  HENT:  Positive for  postnasal drip and rhinorrhea. Negative for ear pain and sore throat.   Eyes:  Negative for pain and visual disturbance.  Respiratory:  Negative for cough and shortness of breath.   Cardiovascular:  Negative for chest pain and palpitations.  Gastrointestinal:  Negative for abdominal pain, constipation, diarrhea, nausea and vomiting.  Genitourinary:  Negative for dysuria and hematuria.  Musculoskeletal:  Positive for myalgias. Negative for arthralgias and back pain.  Skin:  Positive for rash. Negative for color change.  Neurological:  Positive for dizziness and weakness. Negative for seizures and syncope.  All other systems reviewed and are negative.    Physical Exam Triage Vital Signs ED Triage Vitals  Encounter Vitals Group     BP 08/15/23 1614 (!) 142/88     Systolic BP Percentile --      Diastolic BP Percentile --      Pulse Rate 08/15/23 1614 95     Resp 08/15/23 1614 18     Temp 08/15/23 1614 99 F (37.2 C)     Temp Source 08/15/23 1614 Oral     SpO2 08/15/23 1614 98 %     Weight --      Height --      Head Circumference --      Peak Flow --      Pain Score 08/15/23 1612 0     Pain Loc --      Pain Education --      Exclude from Growth Chart --    Orthostatic VS for the past 24 hrs:  BP- Lying Pulse- Lying BP- Sitting Pulse- Sitting BP- Standing at 0 minutes Pulse- Standing at 0 minutes  08/15/23 1639 146/82 85 (!) 153/94 95 (!) 168/103 100    Updated Vital Signs BP (!) 142/88 (BP Location: Right Arm)   Pulse 95   Temp 99 F (37.2 C) (Oral)   Resp 18   LMP 07/16/2023 (Approximate)   SpO2 98%   Visual Acuity Right Eye Distance:   Left Eye Distance:   Bilateral Distance:    Right Eye Near:   Left Eye Near:    Bilateral Near:     Physical Exam Vitals and nursing note reviewed.  Constitutional:      General: She is not in acute distress.    Appearance: She is well-developed. She is morbidly obese. She is ill-appearing. She is not toxic-appearing.  HENT:      Head: Normocephalic and atraumatic.     Right Ear: Hearing, tympanic membrane, ear canal and external ear normal.     Left Ear: Hearing, tympanic membrane, ear canal and external ear normal.     Nose: No congestion or rhinorrhea.  Right Sinus: No maxillary sinus tenderness or frontal sinus tenderness.     Left Sinus: No maxillary sinus tenderness or frontal sinus tenderness.     Mouth/Throat:     Lips: Pink.     Mouth: Mucous membranes are moist.     Pharynx: Uvula midline. No oropharyngeal exudate or posterior oropharyngeal erythema.     Tonsils: No tonsillar exudate.  Eyes:     Conjunctiva/sclera: Conjunctivae normal.     Pupils: Pupils are equal, round, and reactive to light.  Cardiovascular:     Rate and Rhythm: Normal rate and regular rhythm.     Heart sounds: S1 normal and S2 normal. No murmur heard. Pulmonary:     Effort: Pulmonary effort is normal. No respiratory distress.     Breath sounds: Normal breath sounds. No decreased breath sounds, wheezing, rhonchi or rales.  Abdominal:     General: Bowel sounds are normal.     Palpations: Abdomen is soft.     Tenderness: There is no abdominal tenderness.  Musculoskeletal:        General: No swelling.     Cervical back: Neck supple.  Lymphadenopathy:     Head:     Right side of head: No submental, submandibular, tonsillar, preauricular or posterior auricular adenopathy.     Left side of head: No submental, submandibular, tonsillar, preauricular or posterior auricular adenopathy.     Cervical: No cervical adenopathy.     Right cervical: No superficial cervical adenopathy.    Left cervical: No superficial cervical adenopathy.  Skin:    General: Skin is warm and dry.     Capillary Refill: Capillary refill takes less than 2 seconds.     Findings: Rash (Urticarial rash on both arms near the antecubital fossa and extending up and down the arms, left lower leg medially, upper chest and lower neck anteriorly.) present.   Neurological:     Mental Status: She is alert and oriented to person, place, and time.  Psychiatric:        Mood and Affect: Mood normal.      UC Treatments / Results  Labs (all labs ordered are listed, but only abnormal results are displayed) Labs Reviewed - No data to display  EKG   Radiology No results found.  Procedures Procedures (including critical care time)  Medications Ordered in UC Medications - No data to display  Initial Impression / Assessment and Plan / UC Course  I have reviewed the triage vital signs and the nursing notes.  Pertinent labs & imaging results that were available during my care of the patient were reviewed by me and considered in my medical decision making (see chart for details).  Plan of Care: Urticaria of unknown origin: See allergist for further workup as planned.  Stop steroids for now.  Fill EpiPen  and keep it for use if needed.  Albuterol inhaler with spacer, 2 puffs every 4 hours as needed for shortness of breath or wheezing.  Rinse mouth after use.  Continue montelukast  10 mg daily.  Continue cetirizine 10 mg to 20 mg daily (patient has been told by another provider she could take up to 40 mg-she can do that if desired but that is not my direction).  Provided information to read about adrenal insufficiency in adults who have been using steroids.  I do not know that she has adrenal insufficiency but some of her symptoms certainly sound like adrenal insufficiency.  To the best of her knowledge she has had 2 steroid  shots and oral steroids in the last 4 to 5 weeks.  Her new onset fatigue and other symptoms seem to mimic adrenal insufficiency.  Encouraged to stop any steroids including topical or nasal.  May need to see endocrinology for further workup.  Should discuss with primary care.  Advised that if this is adrenal insufficiency it should resolve itself without any treatment with in 4 to 8 weeks.  Get plenty of rest.  Lightheadedness: Her  orthostatics were essentially normal but she did feel lightheaded when she stood up.  Encouraged good hydration.  This could be secondary to adrenal insufficiency, if she does have adrenal insufficiency.  I reviewed the plan of care with the patient and/or the patient's guardian.  The patient and/or guardian had time to ask questions and acknowledged that the questions were answered.  I provided instruction on symptoms or reasons to return here or to go to an ER, if symptoms/condition did not improve, worsened or if new symptoms occurred.  I spent 45 minutes on patient care, including face to face time and care planning/patient management.  Final Clinical Impressions(s) / UC Diagnoses   Final diagnoses:  Lightheadedness  Urticaria of unknown origin  Shortness of breath     Discharge Instructions      Patient has an urticarial rash on her arms legs upper chest at this time.    She has had multiple steroid treatments over the last month.  Additional steroids are not going to be given today I am concerned that she may be having some adrenal insufficiency.  She is having some dizziness and feels somewhat lightheaded when she stands up.  Orthostatic pulse and blood pressures are not clinically significant but she did feel orthostatic when she stood up.  She is having shortness of breath at times with these rashes.  Albuterol inhaler with spacer, 2 puffs every 4 hours as needed for wheezing.  Refilled her EpiPen  to use if needed with an acute rash or shortness of breath.  Needs to see Dr. Kozlow for further workup of her rash and other symptoms.  For now continue montelukast  10 mg daily and cetirizine 10 mg or up to 20 mg daily.  Follow-up with family practice or asthma and allergy  as needed.  May return here if symptoms do not improve, worsen or new symptoms occur.   ED Prescriptions     Medication Sig Dispense Auth. Provider   EPINEPHrine  0.3 mg/0.3 mL IJ SOAJ injection Use as directed  for life threatening allergic reactions 2 each Guss Legacy, FNP   albuterol (VENTOLIN HFA) 108 (90 Base) MCG/ACT inhaler Inhale 2 puffs into the lungs every 4 (four) hours as needed for wheezing or shortness of breath. 8.5 g Guss Legacy, FNP   Spacer/Aero-Holding Chambers (COMPACT SPACE CHAMBER) DEVI Use with the albuterol inhaler 1 each Guss Legacy, FNP      PDMP not reviewed this encounter.   Guss Legacy, FNP 08/15/23 1726

## 2023-08-15 NOTE — Discharge Instructions (Addendum)
 Patient has an urticarial rash on her arms legs upper chest at this time.    She has had multiple steroid treatments over the last month.  Additional steroids are not going to be given today I am concerned that she may be having some adrenal insufficiency.  She is having some dizziness and feels somewhat lightheaded when she stands up.  Orthostatic pulse and blood pressures are not clinically significant but she did feel orthostatic when she stood up.  She is having shortness of breath at times with these rashes.  Albuterol inhaler with spacer, 2 puffs every 4 hours as needed for wheezing.  Refilled her EpiPen  to use if needed with an acute rash or shortness of breath.  Needs to see Dr. Kozlow for further workup of her rash and other symptoms.  For now continue montelukast  10 mg daily and cetirizine 10 mg or up to 20 mg daily.  Follow-up with family practice or asthma and allergy  as needed.  May return here if symptoms do not improve, worsen or new symptoms occur.

## 2023-08-15 NOTE — ED Triage Notes (Signed)
 Pt reports x 1 month ago she went to allergist she received a shot for her allergies after she was itching on her face. Pt seen her pcp was given kenalog x 3 weeks ago no relief waited 2 weeks no better then they prescribed her prednisone she finished on Friday. Pt has hives all over her body. At lunch today she felt she couldn't breathe and she felt like her heart was beating fast she took 2 benadryl.

## 2023-08-17 DIAGNOSIS — J301 Allergic rhinitis due to pollen: Secondary | ICD-10-CM

## 2023-08-17 NOTE — Progress Notes (Signed)
 VIALS EXP 08-17-23

## 2023-08-18 ENCOUNTER — Ambulatory Visit: Payer: Self-pay | Admitting: Allergy and Immunology

## 2023-08-18 DIAGNOSIS — J3089 Other allergic rhinitis: Secondary | ICD-10-CM | POA: Diagnosis not present

## 2023-08-18 LAB — CBC WITH DIFFERENTIAL/PLATELET
Basophils Absolute: 0 10*3/uL (ref 0.0–0.2)
Basos: 0 %
EOS (ABSOLUTE): 0.2 10*3/uL (ref 0.0–0.4)
Eos: 2 %
Hematocrit: 40.6 % (ref 34.0–46.6)
Hemoglobin: 13.3 g/dL (ref 11.1–15.9)
Immature Grans (Abs): 0 10*3/uL (ref 0.0–0.1)
Immature Granulocytes: 0 %
Lymphocytes Absolute: 2.6 10*3/uL (ref 0.7–3.1)
Lymphs: 19 %
MCH: 29 pg (ref 26.6–33.0)
MCHC: 32.8 g/dL (ref 31.5–35.7)
MCV: 89 fL (ref 79–97)
Monocytes Absolute: 0.7 10*3/uL (ref 0.1–0.9)
Monocytes: 5 %
Neutrophils Absolute: 10.1 10*3/uL — ABNORMAL HIGH (ref 1.4–7.0)
Neutrophils: 74 %
Platelets: 442 10*3/uL (ref 150–450)
RBC: 4.59 x10E6/uL (ref 3.77–5.28)
RDW: 12.4 % (ref 11.7–15.4)
WBC: 13.8 10*3/uL — ABNORMAL HIGH (ref 3.4–10.8)

## 2023-08-18 LAB — ALPHA-GAL PANEL
Allergen Lamb IgE: <0.1 kU/L
Beef IgE: <0.1 kU/L
IgE (Immunoglobulin E), Serum: 228 [IU]/mL (ref 6–495)
O215-IgE Alpha-Gal: <0.1 kU/L
Pork IgE: <0.1 kU/L

## 2023-08-18 LAB — SEDIMENTATION RATE: Sed Rate: 22 mm/h (ref 0–32)

## 2023-08-18 LAB — COMPREHENSIVE METABOLIC PANEL WITH GFR
ALT: 6 IU/L (ref 0–32)
AST: 17 IU/L (ref 0–40)
Albumin: 3.9 g/dL (ref 3.9–4.9)
Alkaline Phosphatase: 106 IU/L (ref 44–121)
BUN/Creatinine Ratio: 14 (ref 9–23)
BUN: 11 mg/dL (ref 6–24)
Bilirubin Total: <0.2 mg/dL (ref 0.0–1.2)
CO2: 23 mmol/L (ref 20–29)
Calcium: 8.7 mg/dL (ref 8.7–10.2)
Chloride: 100 mmol/L (ref 96–106)
Creatinine, Ser: 0.76 mg/dL (ref 0.57–1.00)
Globulin, Total: 2.4 g/dL (ref 1.5–4.5)
Glucose: 104 mg/dL — ABNORMAL HIGH (ref 70–99)
Potassium: 4.4 mmol/L (ref 3.5–5.2)
Sodium: 137 mmol/L (ref 134–144)
Total Protein: 6.3 g/dL (ref 6.0–8.5)
eGFR: 99 mL/min/{1.73_m2} (ref >59)

## 2023-08-18 LAB — TSH+FREE T4
Free T4: 1.18 ng/dL (ref 0.82–1.77)
TSH: 4.01 u[IU]/mL (ref 0.450–4.500)

## 2023-08-18 LAB — C-REACTIVE PROTEIN: CRP: 12 mg/L — ABNORMAL HIGH (ref 0–10)

## 2023-08-18 LAB — THYROID PEROXIDASE ANTIBODY: Thyroperoxidase Ab SerPl-aCnc: <9 [IU]/mL (ref 0–34)

## 2023-08-20 NOTE — Telephone Encounter (Signed)
 She set up an appointment to discuss xolair if hives are worsening

## 2023-08-24 ENCOUNTER — Encounter: Payer: Self-pay | Admitting: Allergy

## 2023-08-24 ENCOUNTER — Ambulatory Visit: Admitting: Allergy

## 2023-08-24 VITALS — BP 122/84 | HR 102 | Resp 16

## 2023-08-24 DIAGNOSIS — J3089 Other allergic rhinitis: Secondary | ICD-10-CM | POA: Diagnosis not present

## 2023-08-24 DIAGNOSIS — L509 Urticaria, unspecified: Secondary | ICD-10-CM | POA: Diagnosis not present

## 2023-08-24 DIAGNOSIS — H1045 Other chronic allergic conjunctivitis: Secondary | ICD-10-CM | POA: Diagnosis not present

## 2023-08-24 DIAGNOSIS — L299 Pruritus, unspecified: Secondary | ICD-10-CM

## 2023-08-24 DIAGNOSIS — J302 Other seasonal allergic rhinitis: Secondary | ICD-10-CM

## 2023-08-24 DIAGNOSIS — T781XXD Other adverse food reactions, not elsewhere classified, subsequent encounter: Secondary | ICD-10-CM

## 2023-08-24 NOTE — Progress Notes (Signed)
 Follow-up Note  RE: Angel Richmond MRN: [id number] DOB: 03/11/1981 Date of Office Visit: 08/24/2023   History of present illness: Angel Richmond is a 43 y.o. female with history of allergic rhinitis and conjunctivitis on immunotherapy presenting with hives.  She was last seen in the office on 03/03/2023 for Dr. Kozlow. Since that visit she did report around the end of April that she had been itching and after she received her allergy  shots the itching seems to be worse.  Since she was having increased itching she had lab work ordered (see below) which was unremarkable.  She was informed that if the itching is continued and/or she develops hives to let us  know.  She did inform us  via MyChart that she was having hives and they were persistent and getting worse.  She has had a steroid shot as well as prednisone at urgent care for this issue that did not help much.  It was recommended she come in to talk about potential Xolair for hives and itch control.  Discussed the use of AI scribe software for clinical note transcription with the patient, who gave verbal consent to proceed.  She has been experiencing itching and a rash for the past month and a half, which began mildly the day after her last allergy  shot in April.  She is not gotten another allergy  shot due to the hives and itching issue.  Initially, the symptoms were mild for the first two to three weeks but have significantly worsened in the last couple of weeks. The rash is described as hive-like, appearing on her legs and sometimes on her arms and back, and is exacerbated by heat and body temperature changes, such as taking hot showers or being outside in the sun. The rash is itchy and can be both raised and flat.  She has a history of environmental allergies and is currently in the buildup phase of allergy  shots, with her last shot administered about a month ago. Her seasonal allergy  symptoms seem to be a lot less since starting  the shots. She has been taking Singulair  daily and recently switched from Zyrtec to Xyzal, which she takes once a day, but she has not noticed a significant difference in her symptoms. She also uses Benadryl as needed for allergy  symptoms.  She previously took Zyrtec twice a day but switched to Xyzal two to three days ago. She also takes omeprazole for reflux.   Her symptoms have impacted her daily life, limiting her ability to be outside in the sun and affecting her choice of clothing and footwear due to the rash and itching.  No previous issues with allergy  shots other than a small localized reaction at the injection site.  She has not identified any other triggers of the itching or hives.  She has not altered her diet.    Review of systems: 10pt ROS negative unless noted above in HPI  Medication List: Current Outpatient Medications  Medication Sig Dispense Refill   albuterol  (VENTOLIN  HFA) 108 (90 Base) MCG/ACT inhaler Inhale 2 puffs into the lungs every 4 (four) hours as needed for wheezing or shortness of breath. 8.5 g 0   doxycycline (VIBRAMYCIN) 100 MG capsule Take 100 mg by mouth 2 (two) times daily.     EPINEPHrine  0.3 mg/0.3 mL IJ SOAJ injection Use as directed for life threatening allergic reactions 2 each 0   escitalopram (LEXAPRO) 10 MG tablet Take 10 mg by mouth daily.     hydrOXYzine (ATARAX)  10 MG tablet Take 10 mg by mouth every 6 (six) hours as needed.     levocetirizine (XYZAL) 5 MG tablet Take 5 mg by mouth every evening.     levothyroxine (SYNTHROID, LEVOTHROID) 125 MCG tablet Take 125 mcg by mouth daily before breakfast.     montelukast  (SINGULAIR ) 10 MG tablet TAKE 1 TABLET BY MOUTH EVERYDAY AT BEDTIME 90 tablet 1   Olopatadine HCl (PATADAY OP) Apply to eye daily as needed.     Spacer/Aero-Holding Chambers (COMPACT SPACE CHAMBER) DEVI Use with the albuterol  inhaler 1 each 0   traZODone (DESYREL) 50 MG tablet Take 75 mg by mouth at bedtime.     No current  facility-administered medications for this visit.     Known medication allergies: No Known Allergies   Physical examination: Blood pressure 122/84, pulse (!) 102, resp. rate 16, last menstrual period 07/16/2023, SpO2 98%.  General: Alert, interactive, in no acute distress. HEENT: PERRLA, TMs pearly gray, turbinates non-edematous without discharge, post-pharynx non erythematous. Neck: Supple without lymphadenopathy. Lungs: Clear to auscultation without wheezing, rhonchi or rales. {no increased work of breathing. CV: Normal S1, S2 without murmurs. Abdomen: Nondistended, nontender. Skin: Warm and dry, without lesions or rashes. Extremities:  No clubbing, cyanosis or edema. Neuro:   Grossly intact.  Diagnositics/Labs: Labs:  Component     Latest Ref Rng 08/16/2023  WBC     3.4 - 10.8 x10E3/uL 13.8 (H)   RBC     3.77 - 5.28 x10E6/uL 4.59   Hemoglobin     11.1 - 15.9 g/dL 16.1   HCT     09.6 - 04.5 % 40.6   MCV     79 - 97 fL 89   MCH     26.6 - 33.0 pg 29.0   MCHC     31.5 - 35.7 g/dL 40.9   RDW     81.1 - 91.4 % 12.4   Platelets     150 - 450 x10E3/uL 442   Neutrophils     Not Estab. % 74   Lymphs     Not Estab. % 19   Monocytes     Not Estab. % 5   Eos     Not Estab. % 2   Basos     Not Estab. % 0   NEUT#     1.4 - 7.0 x10E3/uL 10.1 (H)   Lymphs Abs     0.7 - 3.1 x10E3/uL 2.6   Monocytes Absolute     0.1 - 0.9 x10E3/uL 0.7   EOS (ABSOLUTE)     0.0 - 0.4 x10E3/uL 0.2   Basophils Absolute     0.0 - 0.2 x10E3/uL 0.0   Immature Granulocytes     Not Estab. % 0   Immature Grans (Abs)     0.0 - 0.1 x10E3/uL 0.0   Glucose     70 - 99 mg/dL 782 (H)   BUN     6 - 24 mg/dL 11   Creatinine     9.56 - 1.00 mg/dL 2.13   eGFR     >08 MV/HQI/6.96 99   BUN/Creatinine Ratio     9 - 23  14   Sodium     134 - 144 mmol/L 137   Potassium     3.5 - 5.2 mmol/L 4.4   Chloride     96 - 106 mmol/L 100   CO2     20 - 29 mmol/L 23   Calcium  8.7 - 10.2 mg/dL 8.7    Total Protein     6.0 - 8.5 g/dL 6.3   Albumin     3.9 - 4.9 g/dL 3.9   Globulin, Total     1.5 - 4.5 g/dL 2.4   Total Bilirubin     0.0 - 1.2 mg/dL <1.6   Alkaline Phosphatase     44 - 121 IU/L 106   AST     0 - 40 IU/L 17   ALT     0 - 32 IU/L 6   Class Description Allergens Comment   IgE (Immunoglobulin E), Serum     6 - 495 IU/mL 228   Pork IgE     Class 0 kU/L <0.10   Beef IgE     Class 0 kU/L <0.10   Allergen Lamb IgE     Class 0 kU/L <0.10   O215-IgE Alpha-Gal     Class 0 kU/L <0.10   TSH     0.450 - 4.500 uIU/mL 4.010   T4,Free(Direct)     0.82 - 1.77 ng/dL 1.09   Sed Rate     0 - 32 mm/hr 22   CRP     0 - 10 mg/L 12 (H)   Thyroperoxidase Ab SerPl-aCnc     0 - 34 IU/mL <9    Assessment and plan: Urticaria and pruritus: At this time etiology of hives is spontaneous in nature.  Hives can be caused by a variety of different triggers including illness/infection, foods, medications, stings, exercise, pressure, vibrations, extremes of temperature to name a few however majority of the time there is no identifiable trigger.   Labs are unremarkable Take following antihistamine regimen: Xyzal 1 tab twice a day, Pepcid 1 tab twice a day, Singulair  1 tab daily Will start Xolair approval process for hive management.  Benefits, risk and protocol discussed today regarding Xolair.  It is an injection done monthly and can be self-administered if able.   Chronic Rhinitis Seasonal and Perennial Allergic: - Continue avoidance measures for grass, weed, tree, mold, dust mite  - Prevention:  Resume allergy  injections per schedule after starting on Xolair.  Have access to epinephrine  device on your allergy  shot days.  - Symptom control: - Continue Nasal Steroid Spray: Best results if used daily. - Options include Flonase (fluticasone), Nasocort (triamcinolone), Nasonex (mometasome) 1- 2 sprays in each nostril daily.  - All can be purchased over-the-counter if not covered by  insurance. - Use less frequently if airway gets too dry. - Continue Singulair  (Montelukast ) 10mg  nightly.   - Discontinue if nightmares of behavior changes. - Continue Antihistamine: daily or daily as needed.   -Options include Zyrtec (Cetirizine) 10mg , Claritin (Loratadine) 10mg , Allegra (Fexofenadine) 180mg , or Xyzal (Levocetirinze) 5mg  - Can be purchased over-the-counter if not covered by insurance.  Allergic Conjunctivitis:  - Continue allergy  Eye drops-great options include Pataday (Olopatadine) or Zaditor (ketotifen) for eye symptoms daily as needed-both sold over the counter if not covered by insurance. and Rewetting Drops such as Systane,TheraTears, etc  -Avoid eye drops that say red eye relief as they may contain medications that dry out your eyes.   Oral Allergy  Syndrome: watermelon  - Your symptoms are not consistent with true food allergies, and are more likely due to oral allergy  syndrome. - These symptoms are not life-threatening and are because of a cross reaction between a pollen you are allergic to, and to a protein in specific foods (such as fresh fruits, vegetables, and nuts). -  If you can eat these things it is fine to continue to do so.  If not, you may avoid these fresh fruits.  Heating these foods should allow them to be consumed without symptoms. - You may notice increase in symptoms during allergy  season, this is to be expected. - Allergy   Immunotherapy can help lessen and some cases cure these symptoms and should be considered if they worsen.    Follow up: 3-4 months in clinic   I appreciate the opportunity to take part in Hermila's care. Please do not hesitate to contact me with questions.  Sincerely,   Catha Clink, MD Allergy /Immunology [facility]

## 2023-08-24 NOTE — Patient Instructions (Addendum)
 Hives and itching: At this time etiology of hives is spontaneous in nature.  Hives can be caused by a variety of different triggers including illness/infection, foods, medications, stings, exercise, pressure, vibrations, extremes of temperature to name a few however majority of the time there is no identifiable trigger.   Labs are unremarkable Take following antihistamine regimen: Xyzal 1 tab twice a day, Pepcid 1 tab twice a day, Singulair  1 tab daily Will start Xolair approval process for hive management.  Benefits, risk and protocol discussed today regarding Xolair.  It is an injection done monthly and can be self-administered if able.   Chronic Rhinitis Seasonal and Perennial Allergic: - Continue avoidance measures for grass, weed, tree, mold, dust mite  - Prevention:  Resume allergy  injections per schedule after starting on Xolair.  Have access to epinephrine  device on your allergy  shot days.  - Symptom control: - Continue Nasal Steroid Spray: Best results if used daily. - Options include Flonase (fluticasone), Nasocort (triamcinolone), Nasonex (mometasome) 1- 2 sprays in each nostril daily.  - All can be purchased over-the-counter if not covered by insurance. - Use less frequently if airway gets too dry. - Continue Singulair  (Montelukast ) 10mg  nightly.   - Discontinue if nightmares of behavior changes. - Continue Antihistamine: daily or daily as needed.   -Options include Zyrtec (Cetirizine) 10mg , Claritin (Loratadine) 10mg , Allegra (Fexofenadine) 180mg , or Xyzal (Levocetirinze) 5mg  - Can be purchased over-the-counter if not covered by insurance.  Allergic Conjunctivitis:  - Continue allergy  Eye drops-great options include Pataday (Olopatadine) or Zaditor (ketotifen) for eye symptoms daily as needed-both sold over the counter if not covered by insurance. and Rewetting Drops such as Systane,TheraTears, etc  -Avoid eye drops that say red eye relief as they may contain medications that  dry out your eyes.   Oral Allergy  Syndrome: watermelon  - Your symptoms are not consistent with true food allergies, and are more likely due to oral allergy  syndrome. - These symptoms are not life-threatening and are because of a cross reaction between a pollen you are allergic to, and to a protein in specific foods (such as fresh fruits, vegetables, and nuts). - If you can eat these things it is fine to continue to do so.  If not, you may avoid these fresh fruits.  Heating these foods should allow them to be consumed without symptoms. - You may notice increase in symptoms during allergy  season, this is to be expected. - Allergy   Immunotherapy can help lessen and some cases cure these symptoms and should be considered if they worsen.    Follow up: 3-4 months in clinic

## 2023-08-25 ENCOUNTER — Telehealth: Payer: Self-pay | Admitting: *Deleted

## 2023-08-25 NOTE — Telephone Encounter (Signed)
-----   Message from Brian Campanile sent at 08/24/2023 11:41 AM EDT ----- Xolair for hives

## 2023-08-25 NOTE — Telephone Encounter (Signed)
 Called patient and advised approval, copay card and submit to [employer]. Will reach out to patient to schedeule start iwht 1 hr wait after delivery set

## 2023-08-26 MED ORDER — LEVOCETIRIZINE DIHYDROCHLORIDE 5 MG PO TABS: ORAL_TABLET | ORAL | 5 refills | Status: AC

## 2023-08-26 MED ORDER — FAMOTIDINE 20 MG PO TABS: ORAL_TABLET | ORAL | 5 refills | Status: AC

## 2023-12-28 ENCOUNTER — Ambulatory Visit: Admitting: Allergy

## 2024-01-06 ENCOUNTER — Telehealth: Payer: Self-pay | Admitting: Allergy

## 2024-01-06 NOTE — Telephone Encounter (Signed)
 Called patient to schedule Xolair reapproval appointment. Patient stopped injection and does not wish to continue.

## 2024-01-28 ENCOUNTER — Encounter (HOSPITAL_BASED_OUTPATIENT_CLINIC_OR_DEPARTMENT_OTHER): Payer: Self-pay

## 2024-01-28 ENCOUNTER — Ambulatory Visit (INDEPENDENT_AMBULATORY_CARE_PROVIDER_SITE_OTHER): Admit: 2024-01-28 | Discharge: 2024-01-28 | Disposition: A | Discharge status: Home / Self Care | Admitting: Radiology

## 2024-01-28 ENCOUNTER — Ambulatory Visit (HOSPITAL_BASED_OUTPATIENT_CLINIC_OR_DEPARTMENT_OTHER)
Admission: RE | Admit: 2024-01-28 | Discharge: 2024-01-28 | Disposition: A | Discharge status: Home / Self Care | Source: Ambulatory Visit | Attending: Family Medicine | Admitting: Family Medicine

## 2024-01-28 ENCOUNTER — Other Ambulatory Visit (HOSPITAL_BASED_OUTPATIENT_CLINIC_OR_DEPARTMENT_OTHER): Payer: Self-pay

## 2024-01-28 ENCOUNTER — Ambulatory Visit (HOSPITAL_BASED_OUTPATIENT_CLINIC_OR_DEPARTMENT_OTHER): Payer: Self-pay | Admitting: Family Medicine

## 2024-01-28 VITALS — BP 128/82 | HR 97 | Temp 98.7°F | Resp 20

## 2024-01-28 DIAGNOSIS — M7750 Other enthesopathy of unspecified foot: Secondary | ICD-10-CM | POA: Diagnosis not present

## 2024-01-28 DIAGNOSIS — M79672 Pain in left foot: Secondary | ICD-10-CM

## 2024-01-28 MED ORDER — DICLOFENAC SODIUM 50 MG PO TBEC
50.0000 mg | DELAYED_RELEASE_TABLET | Freq: Two times a day (BID) | ORAL | 0 refills | Status: AC | PRN
  Filled 2024-01-28: qty 30, 15d supply, fill #0

## 2024-01-28 NOTE — Discharge Instructions (Addendum)
 Tendinitis and left foot pain: X-rays appear normal or negative.  Will adjust the plan of care, if needed if the radiology review differs.  Due to persistent pain for a month, will use a postop shoe to provide support for the foot and rest for the tendons.  Encouraged use of ice therapy (ice packs 30 minutes on and 30 minutes off and repeat as often as needed) for 24 to 48 hours.  Then switch to heat therapy (heat packs 30 minutes on and 30 minutes off and repeat as often as needed).   Diclofenac, 50 mg, 1 pill twice daily with food for foot pain.  If symptoms do not improve, if symptoms worsen or if new symptoms occur, needs to see primary care and get a referral to orthopedics.  Follow-up here as needed.

## 2024-01-28 NOTE — ED Provider Notes (Signed)
 [facility]    CSN: [id number] Arrival date & time: 01/28/24  9071      History   Chief Complaint Chief Complaint  Patient presents with   Foot Pain    Entered by patient    HPI Angel Richmond is a 43 y.o. female.   43 year old female who was walking on a gravel area on approximately 12/29/2023.  The gravel shifted and her foot twisted and she fell.  She twisted her left ankle and foot at the time that she fell.  She did not ever have any bruises but reports she rarely bruises.  She is having persistent and worsening left midfoot and left ankle pain.  Most of the pain is on the top and left side of her foot.  She has used ibuprofen with some relief.  She is concerned that she might have some kind of injury she is not aware of since the pain seems to be worsening not improving.   Foot Pain Pertinent negatives include no chest pain, no abdominal pain and no shortness of breath.    Past Medical History:  Diagnosis Date   Allergic rhinitis    IBS (irritable bowel syndrome)    Oral allergy  syndrome    Thyroid  disease     Patient Active Problem List   Diagnosis Date Noted   Seasonal and perennial allergic rhinitis 08/07/2022   Pollen-food allergy  08/07/2022   Conjunctivitis 12/20/2014    Past Surgical History:  Procedure Laterality Date   CESAREAN SECTION     CHOLECYSTECTOMY     OVARIAN CYST REMOVAL      OB History   No obstetric history on file.      Home Medications    Prior to Admission medications   Medication Sig Start Date End Date Taking? Authorizing Provider  diclofenac (VOLTAREN) 50 MG EC tablet Take 1 tablet (50 mg total) by mouth 2 (two) times daily as needed for up to 15 days (take with food). 01/28/24 02/12/24 Yes Ival Domino, FNP  albuterol  (VENTOLIN  HFA) 108 (90 Base) MCG/ACT inhaler Inhale 2 puffs into the lungs every 4 (four) hours as needed for wheezing or shortness of breath. 08/15/23   Ival Domino, FNP  doxycycline  (VIBRAMYCIN) 100 MG capsule Take 100 mg by mouth 2 (two) times daily. 08/21/23   [provider]  EPINEPHrine  0.3 mg/0.3 mL IJ SOAJ injection Use as directed for life threatening allergic reactions 08/15/23   Ival Domino, FNP  escitalopram (LEXAPRO) 10 MG tablet Take 10 mg by mouth daily. 08/10/23   [provider]  famotidine  (PEPCID ) 20 MG tablet Take one tablet twice daily as directed. 08/26/23   Jeneal Danita Macintosh, MD  hydrOXYzine (ATARAX) 10 MG tablet Take 10 mg by mouth every 6 (six) hours as needed. 08/20/23   [provider]  levocetirizine (XYZAL ) 5 MG tablet Take one tablet by mouth twice daily as directed. 08/26/23   Jeneal Danita Macintosh, MD  levothyroxine (SYNTHROID, LEVOTHROID) 125 MCG tablet Take 125 mcg by mouth daily before breakfast.    [provider]  montelukast  (SINGULAIR ) 10 MG tablet TAKE 1 TABLET BY MOUTH EVERYDAY AT BEDTIME 08/09/23   Kozlow, Eric J, MD  Olopatadine HCl (PATADAY OP) Apply to eye daily as needed.    [provider]  Spacer/Aero-Holding Chambers (COMPACT SPACE CHAMBER) DEVI Use with the albuterol  inhaler 08/15/23   Ival Domino, FNP  traZODone (DESYREL) 50 MG tablet Take 75 mg by mouth at bedtime. 06/11/22   [provider]    Family History History reviewed. No pertinent family history.  Social History Social History   Tobacco Use   Smoking status: Never   Smokeless tobacco: Never  Vaping Use   Vaping status: Never Used  Substance Use Topics   Alcohol use: No   Drug use: No     Allergies   Patient has no known allergies.   Review of Systems Review of Systems  Constitutional:  Negative for chills and fever.  HENT:  Negative for ear pain and sore throat.   Eyes:  Negative for pain and visual disturbance.  Respiratory:  Negative for cough and shortness of breath.   Cardiovascular:  Negative for chest pain and palpitations.  Gastrointestinal:  Negative for abdominal pain,  constipation, diarrhea, nausea and vomiting.  Genitourinary:  Negative for dysuria and hematuria.  Musculoskeletal:  Negative for arthralgias, back pain and joint swelling (No bruising and no swelling but left midfoot and left lateral ankle pain.).  Skin:  Negative for color change and rash.  Neurological:  Negative for seizures and syncope.  All other systems reviewed and are negative.    Physical Exam Triage Vital Signs ED Triage Vitals  Encounter Vitals Group     BP 01/28/24 0938 128/82     Girls Systolic BP Percentile --      Girls Diastolic BP Percentile --      Boys Systolic BP Percentile --      Boys Diastolic BP Percentile --      Pulse Rate 01/28/24 0938 97     Resp 01/28/24 0938 20     Temp 01/28/24 0938 98.7 F (37.1 C)     Temp Source 01/28/24 0938 Oral     SpO2 01/28/24 0938 95 %     Weight --      Height --      Head Circumference --      Peak Flow --      Pain Score 01/28/24 0936 5     Pain Loc --      Pain Education --      Exclude from Growth Chart --    No data found.  Updated Vital Signs BP 128/82 (BP Location: Right Arm)   Pulse 97   Temp 98.7 F (37.1 C) (Oral)   Resp 20   LMP 01/21/2024 (Approximate)   SpO2 95%   Visual Acuity Right Eye Distance:   Left Eye Distance:   Bilateral Distance:    Right Eye Near:   Left Eye Near:    Bilateral Near:     Physical Exam Vitals and nursing note reviewed.  Constitutional:      General: She is not in acute distress.    Appearance: She is well-developed. She is not ill-appearing or toxic-appearing.  HENT:     Head: Normocephalic and atraumatic.     Right Ear: Hearing, tympanic membrane, ear canal and external ear normal.     Left Ear: Hearing, tympanic membrane, ear canal and external ear normal.     Nose: No congestion or rhinorrhea.     Right Sinus: No maxillary sinus tenderness or frontal sinus tenderness.     Left Sinus: No maxillary sinus tenderness or frontal sinus tenderness.      Mouth/Throat:     Lips: Pink.     Mouth: Mucous membranes are moist.     Pharynx: Uvula midline. No oropharyngeal exudate or posterior oropharyngeal erythema.     Tonsils: No tonsillar exudate.  Eyes:  Conjunctiva/sclera: Conjunctivae normal.     Pupils: Pupils are equal, round, and reactive to light.  Cardiovascular:     Rate and Rhythm: Normal rate and regular rhythm.     Pulses:          Dorsalis pedis pulses are 2+ on the right side and 2+ on the left side.       Posterior tibial pulses are 2+ on the right side and 2+ on the left side.     Heart sounds: S1 normal and S2 normal. No murmur heard. Pulmonary:     Effort: Pulmonary effort is normal. No respiratory distress.     Breath sounds: Normal breath sounds. No decreased breath sounds, wheezing, rhonchi or rales.  Abdominal:     General: Bowel sounds are normal.     Palpations: Abdomen is soft.     Tenderness: There is no abdominal tenderness.  Musculoskeletal:        General: No swelling.     Cervical back: Neck supple.     Right hip: Normal.     Left hip: Normal.     Right upper leg: Normal.     Left upper leg: Normal.     Right knee: Normal.     Left knee: Normal.     Right lower leg: Normal.     Left lower leg: Normal.     Right ankle: Normal.     Left ankle: No swelling, deformity, ecchymosis or lacerations. Tenderness (With dorsiflexion and with lateral movement of the foot and ankle) present. Normal range of motion. Anterior drawer test negative. Normal pulse.     Left Achilles Tendon: Normal.     Right foot: Normal.     Left foot: Normal range of motion and normal capillary refill. Tenderness (Midfoot and lateral foot near the ankle.) and bony tenderness (Midfoot and lateral foot near the ankle.) present. No swelling, deformity, bunion, Charcot foot, foot drop, prominent metatarsal heads, laceration or crepitus. Normal pulse.  Feet:     Left foot:     Skin integrity: Skin integrity normal.  Lymphadenopathy:      Head:     Right side of head: No submental, submandibular, tonsillar, preauricular or posterior auricular adenopathy.     Left side of head: No submental, submandibular, tonsillar, preauricular or posterior auricular adenopathy.     Cervical: No cervical adenopathy.     Right cervical: No superficial cervical adenopathy.    Left cervical: No superficial cervical adenopathy.  Skin:    General: Skin is warm and dry.     Capillary Refill: Capillary refill takes less than 2 seconds.     Findings: No rash.  Neurological:     Mental Status: She is alert and oriented to person, place, and time.  Psychiatric:        Mood and Affect: Mood normal.      UC Treatments / Results  Labs (all labs ordered are listed, but only abnormal results are displayed)  Contains abnormal data Comprehensive metabolic panel with GFR: 08/16/23:    Component Ref Range & Units (hover) 5 mo ago  Glucose 104 High   BUN 11  Creatinine, Ser 0.76  eGFR 99  BUN/Creatinine Ratio 14  Sodium 137  Potassium 4.4  Chloride 100  CO2 23  Calcium 8.7  Total Protein 6.3  Albumin 3.9  Globulin, Total 2.4  Bilirubin Total <0.2  Alkaline Phosphatase 106  AST 17  ALT 6  Resulting Agency [facility]  EKG   Radiology No results found.  Procedures Procedures (including critical care time)  Medications Ordered in UC Medications - No data to display  Initial Impression / Assessment and Plan / UC Course  I have reviewed the triage vital signs and the nursing notes.  Pertinent labs & imaging results that were available during my care of the patient were reviewed by me and considered in my medical decision making (see chart for details).  Plan of Care: Tendinitis and left foot pain: X-rays appear normal or negative.  Will adjust the plan of care, if needed if the radiology review differs.  Due to persistent pain for a month, will use a postop shoe to provide support for the foot and rest for the  tendons.Diclofenac, 50 mg, 1 pill twice daily with food for foot pain.  See discharge instructions for additional patient education.  If symptoms do not improve, if symptoms worsen or if new symptoms occur, needs to see primary care and get a referral to orthopedics.  Follow-up here as needed.  Work excuse provided if needed.  I reviewed the plan of care with the patient and/or the patient's guardian.  The patient and/or guardian had time to ask questions and acknowledged that the questions were answered.  I provided instruction on symptoms or reasons to return here or to go to an ER, if symptoms/condition did not improve, worsened or if new symptoms occurred.  Final Clinical Impressions(s) / UC Diagnoses   Final diagnoses:  Left foot pain  Tendonitis of foot     Discharge Instructions      Tendinitis and left foot pain: X-rays appear normal or negative.  Will adjust the plan of care, if needed if the radiology review differs.  Due to persistent pain for a month, will use a postop shoe to provide support for the foot and rest for the tendons.  Encouraged use of ice therapy (ice packs 30 minutes on and 30 minutes off and repeat as often as needed) for 24 to 48 hours.  Then switch to heat therapy (heat packs 30 minutes on and 30 minutes off and repeat as often as needed).   Diclofenac, 50 mg, 1 pill twice daily with food for foot pain.  If symptoms do not improve, if symptoms worsen or if new symptoms occur, needs to see primary care and get a referral to orthopedics.  Follow-up here as needed.     ED Prescriptions     Medication Sig Dispense Auth. Provider   diclofenac (VOLTAREN) 50 MG EC tablet Take 1 tablet (50 mg total) by mouth 2 (two) times daily as needed for up to 15 days (take with food). 30 tablet Rasean Joos, FNP      PDMP not reviewed this encounter.   Ival Domino, FNP 01/28/24 1024

## 2024-01-28 NOTE — Progress Notes (Signed)
 No fractures.  Patient updated.  She will continue to use the postop shoe and follow-up if needed.

## 2024-01-28 NOTE — ED Triage Notes (Signed)
 Pt states a month ago she was walking on gravel when she fell. Since then she has continued to have increased left foot pain. Pain is located on the outside, top and under her heel and is an ache/throb. Pt has taken ibuprofen with some relief.
